# Patient Record
Sex: Male | Born: 1970 | Race: White | Hispanic: No | Marital: Single | Smoking: Never smoker
Health system: Southern US, Community
[De-identification: ages and names within clinical notes are randomized; demographics above are authoritative.]

## PROBLEM LIST (undated history)

## (undated) DIAGNOSIS — I10 Essential (primary) hypertension: Secondary | ICD-10-CM

## (undated) HISTORY — PX: EYE SURGERY: SHX253

---

## 2008-03-11 ENCOUNTER — Ambulatory Visit: Payer: Self-pay | Admitting: Internal Medicine

## 2008-06-14 ENCOUNTER — Ambulatory Visit: Payer: Self-pay | Admitting: Internal Medicine

## 2009-05-03 IMAGING — CR DG CHEST 2V
1 series · 2 of 2 positions shown · non-contrast
Comparison: none

REASON FOR EXAM: chest pain
COMMENTS:

[Series 1: view not recorded · 0.17mm/px · 2 of 2 slices shown]
[im 1/2]
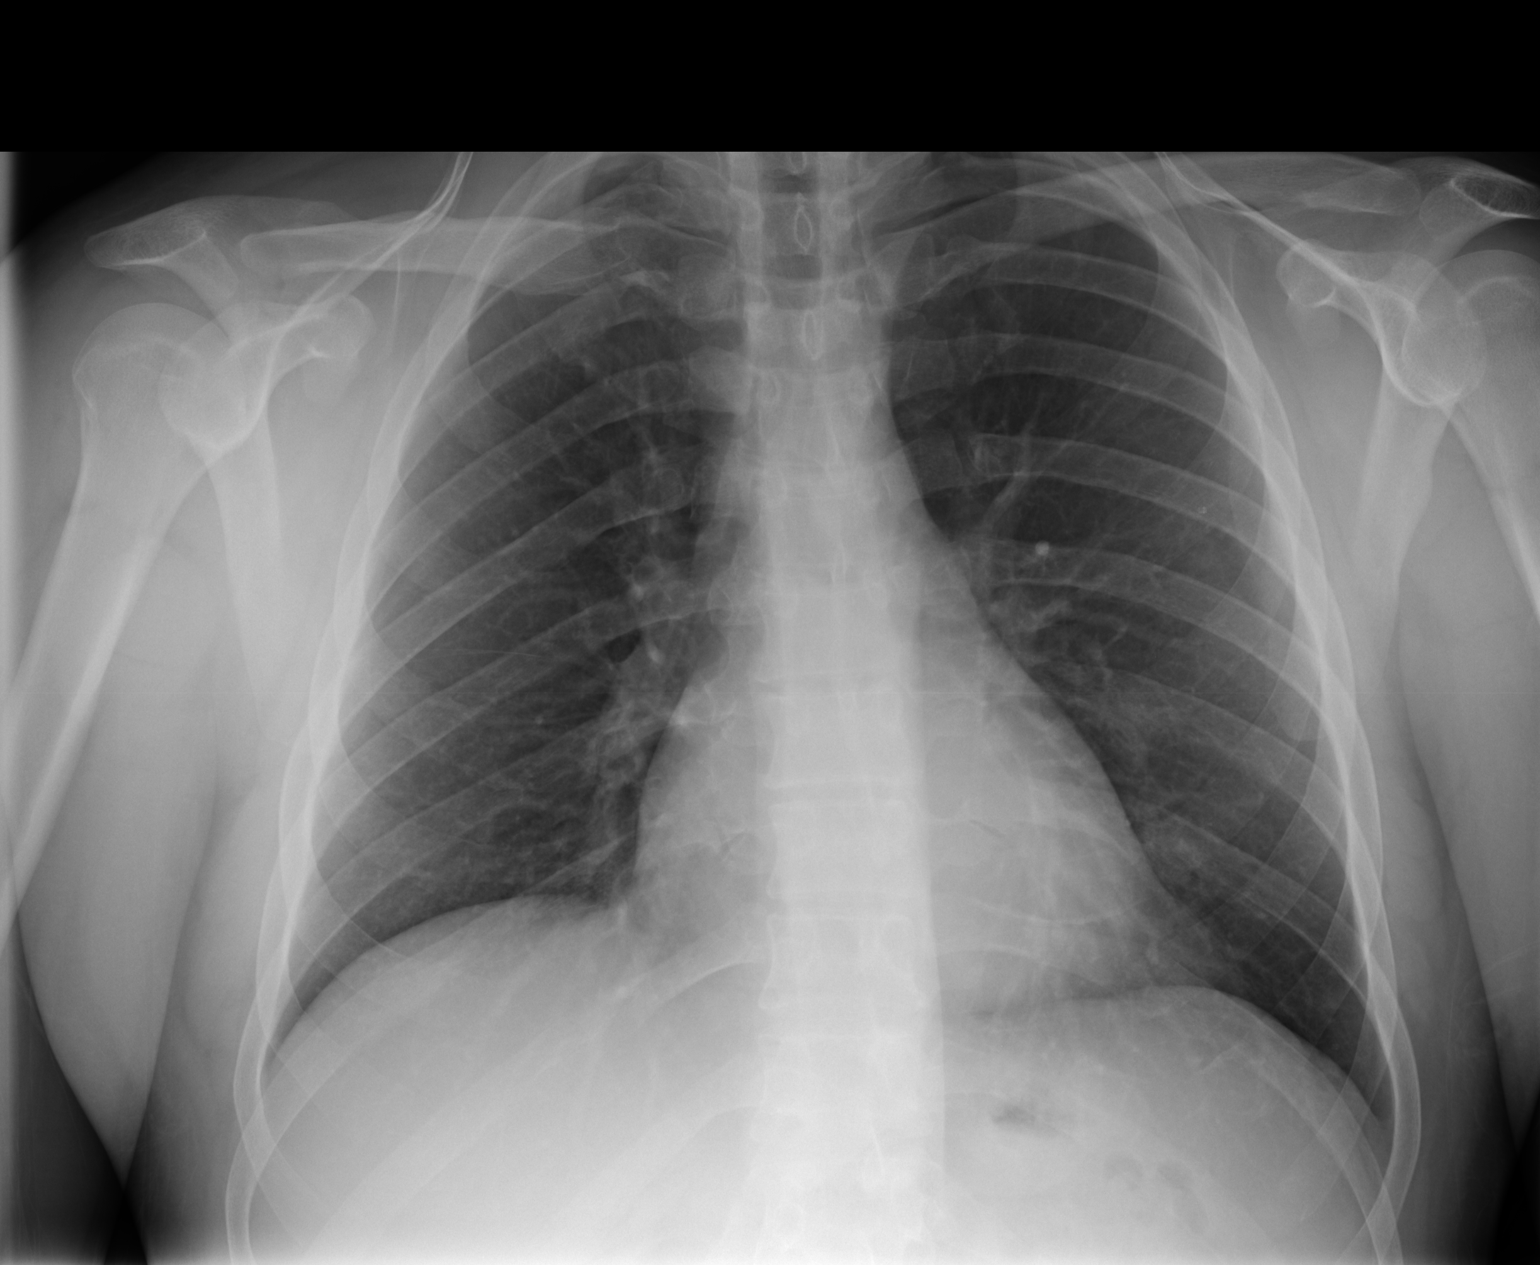
[im 2/2]
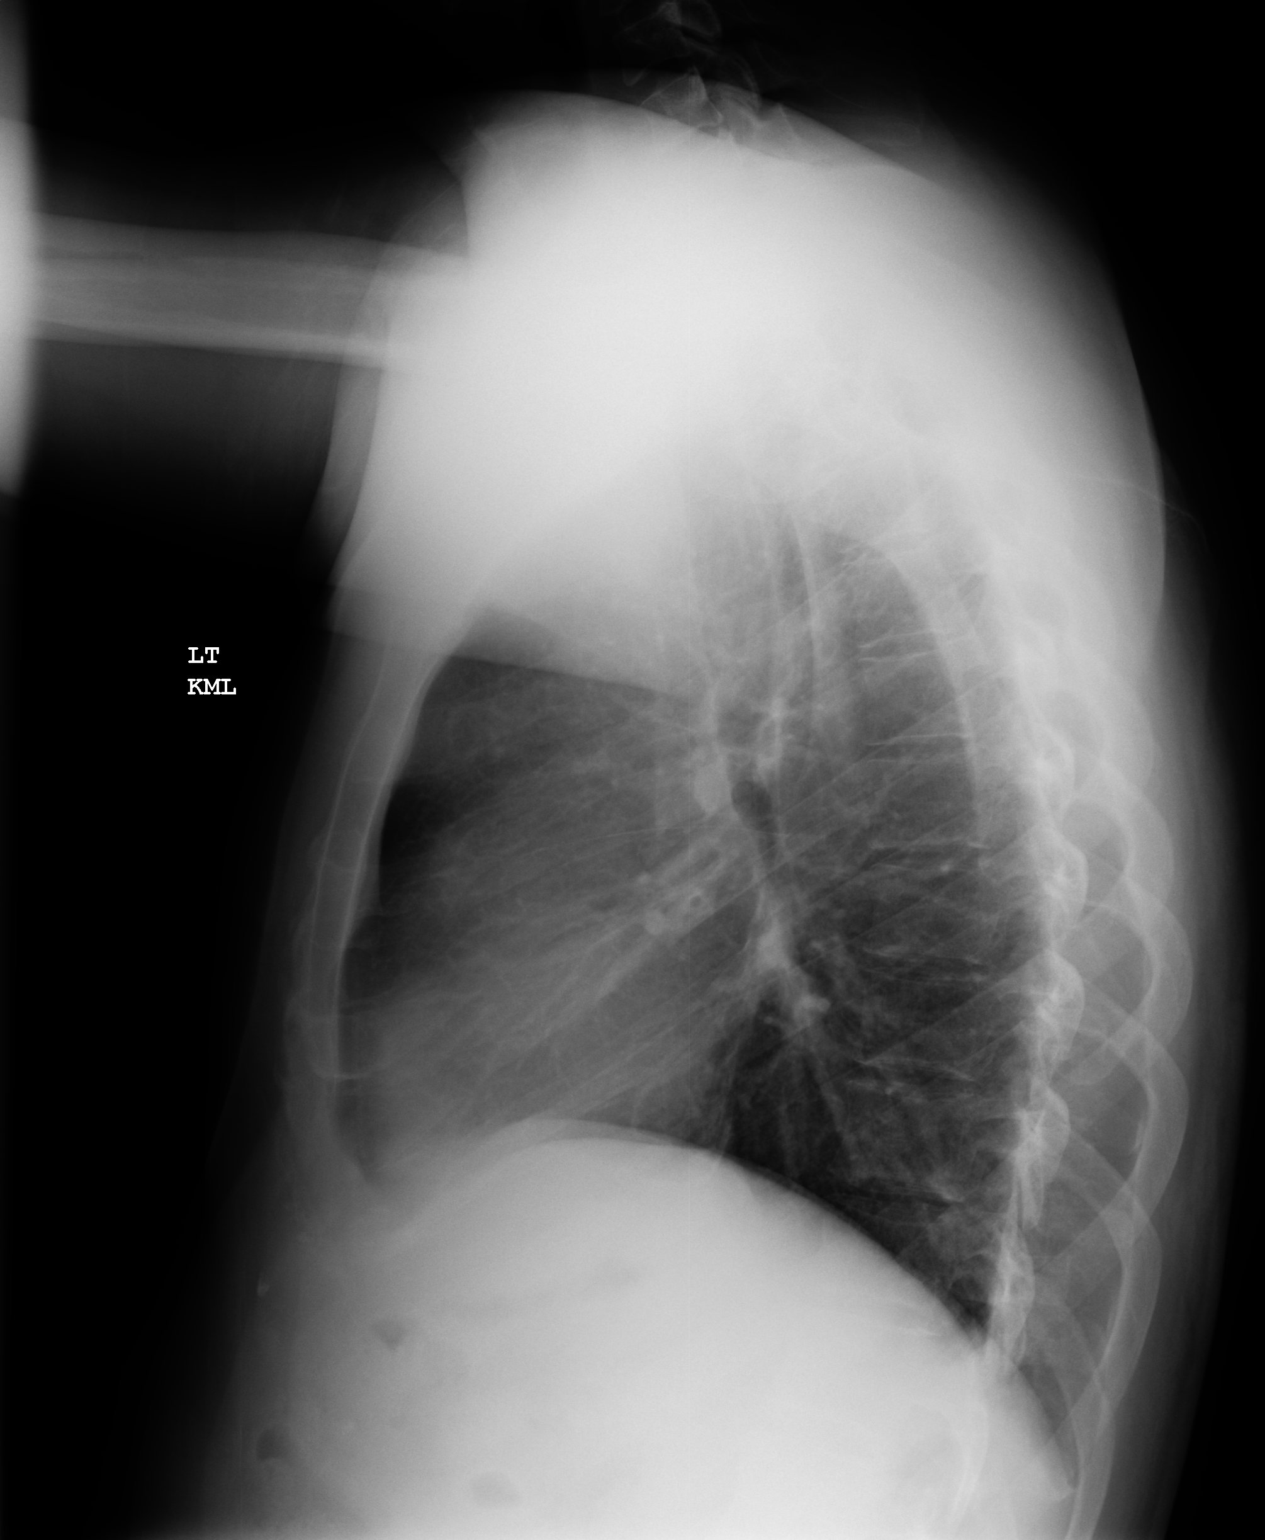

[2 of 2 positions shown; findings below may reference images not displayed]

PROCEDURE:     DXR - DXR CHEST PA (OR AP) AND LATERAL  - March 11, 2008 [DATE]

RESULT:     There is no previous exam for comparison.

The lungs are clear. The heart and pulmonary vessels are normal. The bony
and mediastinal structures are unremarkable. There is no effusion. There is
no pneumothorax or evidence of congestive failure.
IMPRESSION: No acute cardiopulmonary disease.

## 2009-05-28 ENCOUNTER — Emergency Department: Payer: Self-pay | Admitting: Emergency Medicine

## 2009-06-30 ENCOUNTER — Ambulatory Visit: Payer: Self-pay | Admitting: Otolaryngology

## 2009-08-06 IMAGING — US US RENAL KIDNEY
1 series · 17 of 25 positions shown · non-contrast
Comparison: none

REASON FOR EXAM: flank hematuria
COMMENTS:

[Series 1: us renal kidney · 17 of 26 slices shown]
[im 1/26]
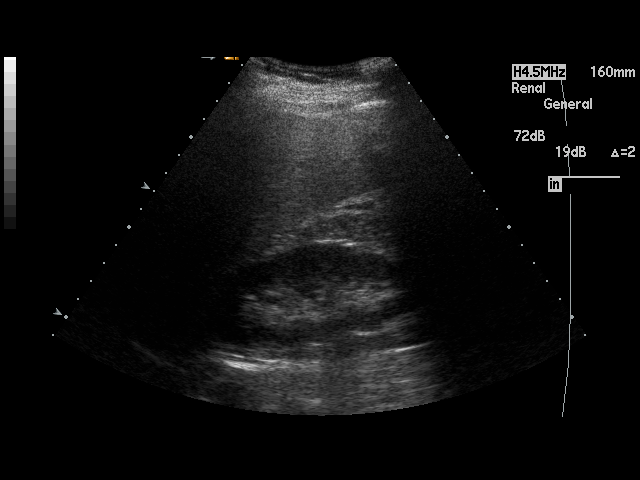
[im 3/26]
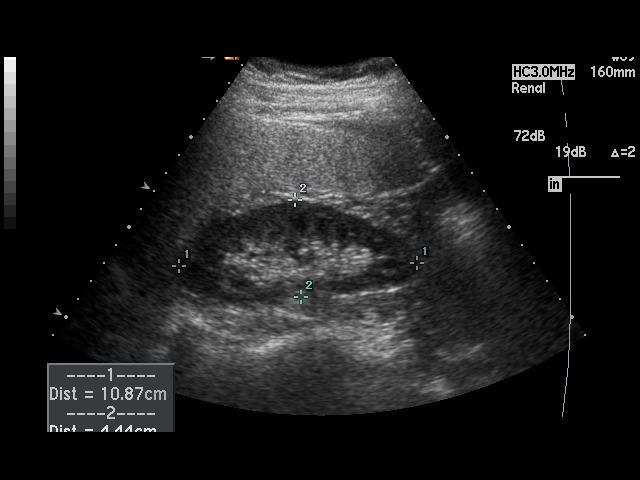
[im 4/26]
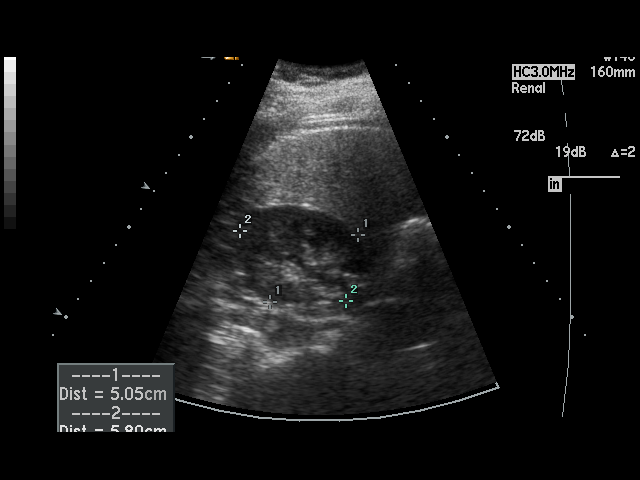
[im 6/26]
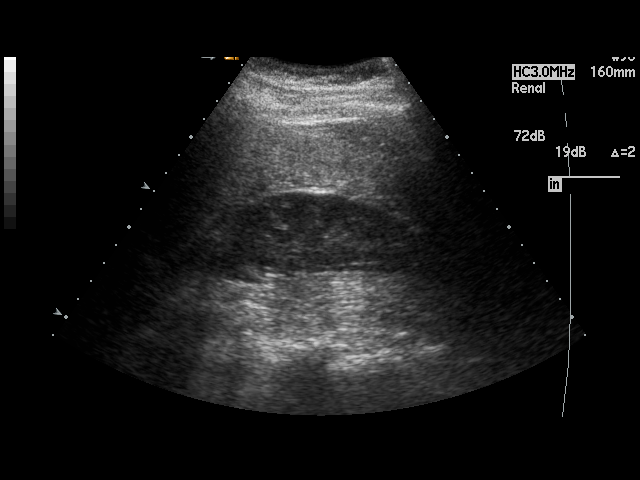
[im 7/26]
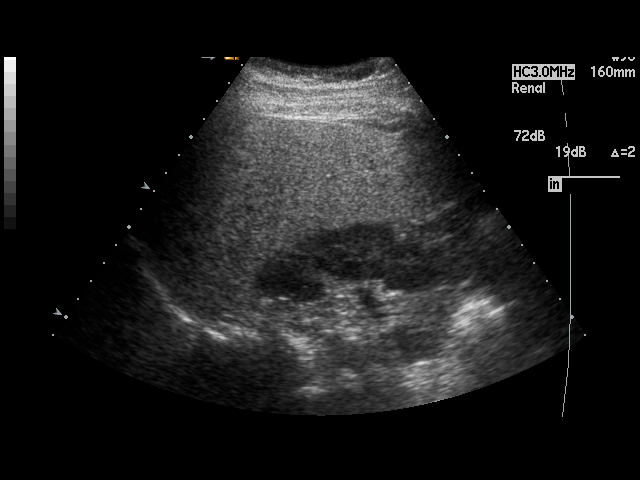
[im 9/26]
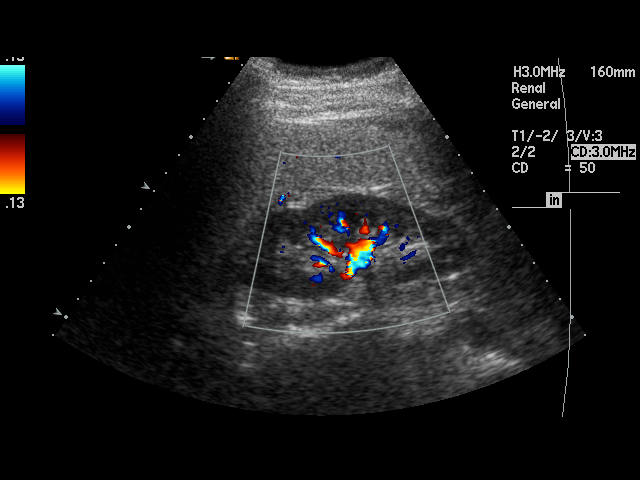
[im 10/26]
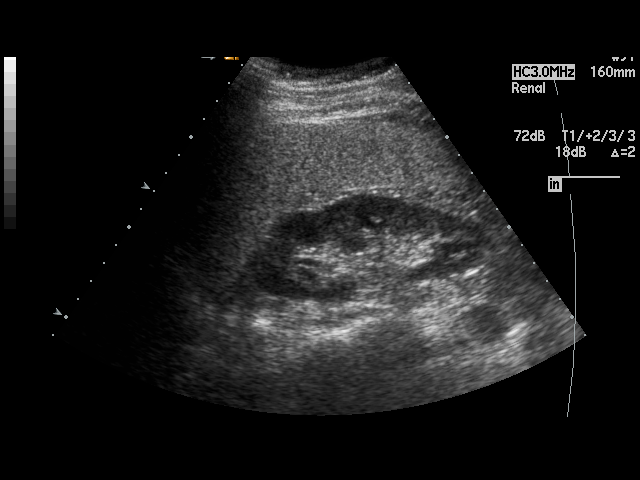
[im 12/26]
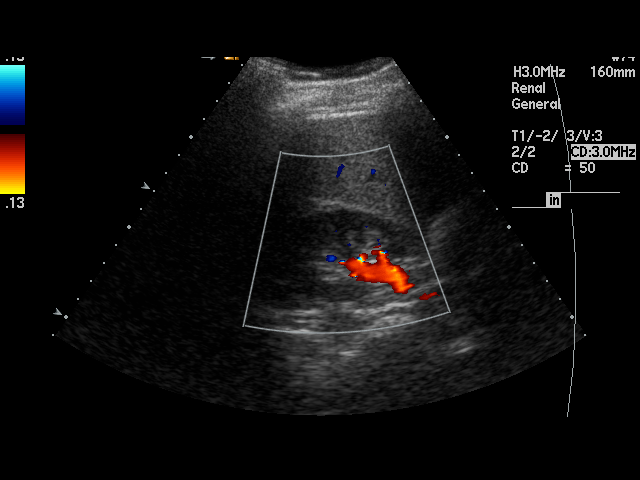
[im 13/26]
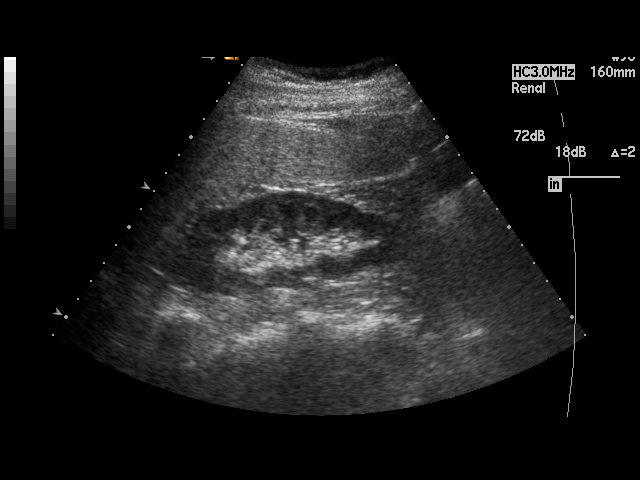
[im 14/26]
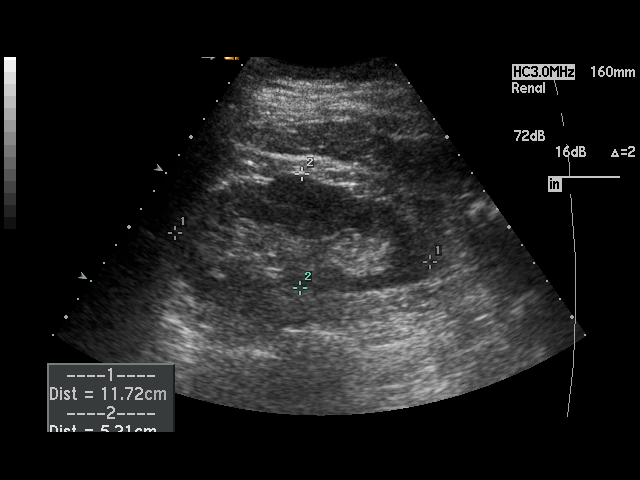
[im 16/26]
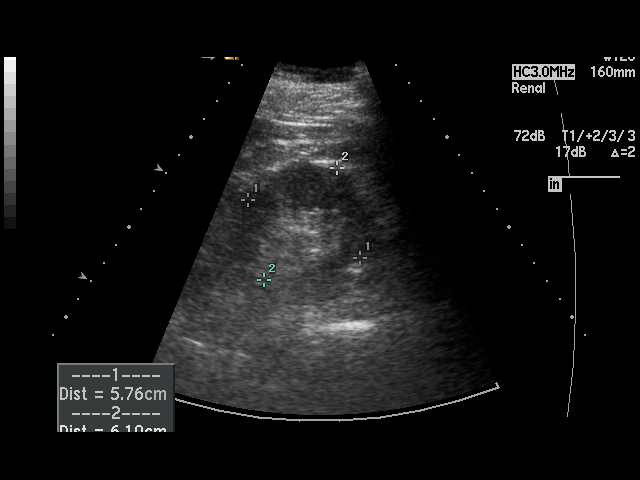
[im 17/26]
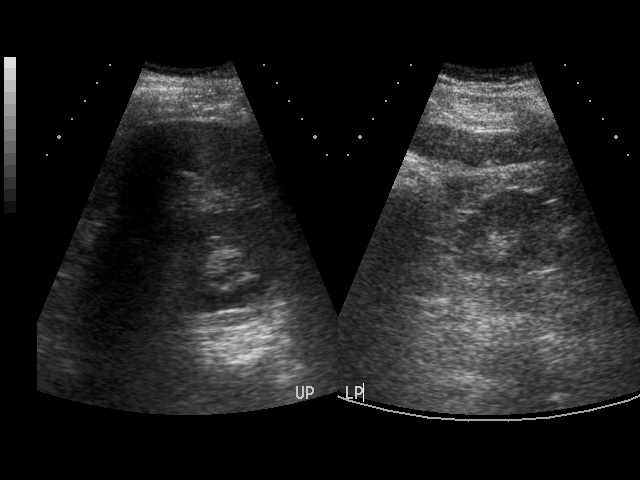
[im 19/26]
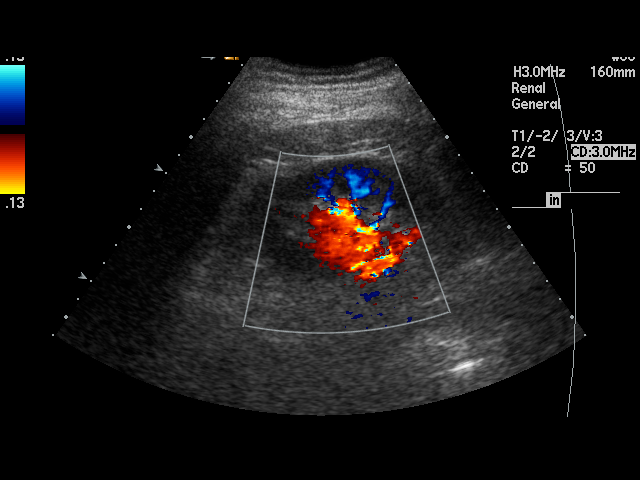
[im 20/26]
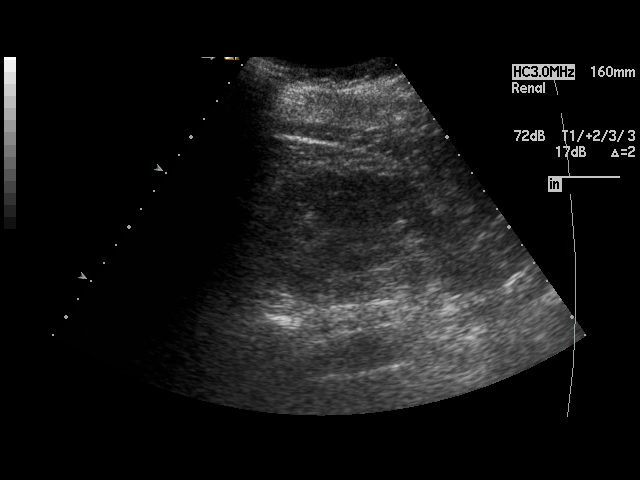
[im 22/26]
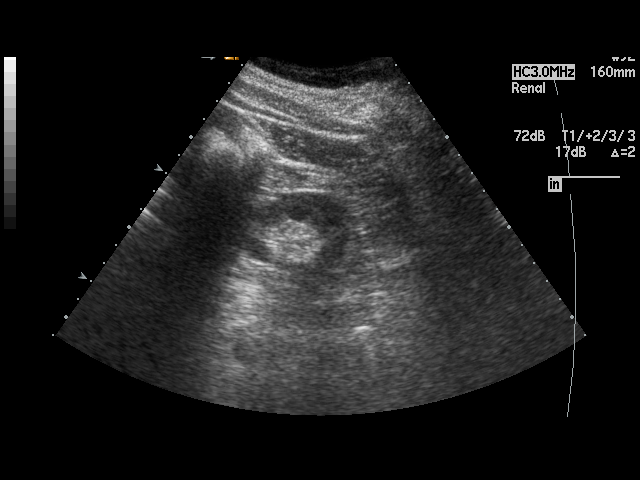
[im 23/26]
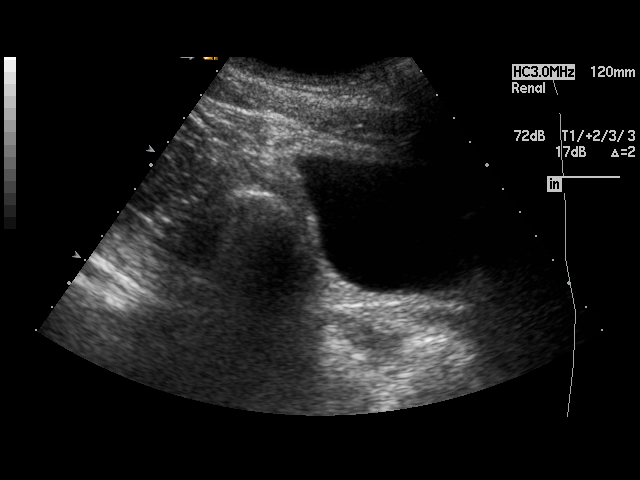
[im 26/26]
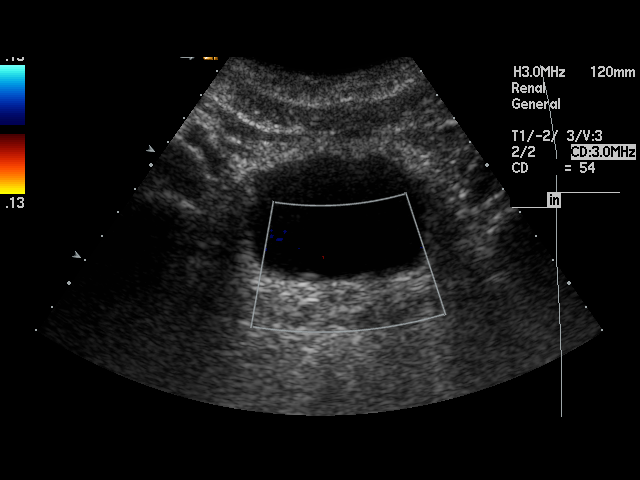

[17 of 25 positions shown; findings below may reference images not displayed]

PROCEDURE:     US  - US KIDNEY  - June 14, 2008  [DATE]

RESULT:     Ultrasound of the kidneys demonstrates the RIGHT kidney measures
11.1 x 5.0 x 5.8 cm while the LEFT kidney measures 11.7 x 5.5 x 6.1 cm.
There is no evidence of hydronephrosis. There is no shadowing to suggest
stones. No cysts or obstructive changes are evident. There is fluid in the
bladder consistent with urine.
IMPRESSION: Unremarkable ultrasound of the kidneys. No definite mass or
stones identified.

## 2010-07-29 ENCOUNTER — Emergency Department: Payer: Self-pay | Admitting: Emergency Medicine

## 2011-08-06 DIAGNOSIS — H5440 Blindness, one eye, unspecified eye: Secondary | ICD-10-CM | POA: Insufficient documentation

## 2016-09-21 ENCOUNTER — Other Ambulatory Visit: Payer: Self-pay | Admitting: Family Medicine

## 2016-09-21 ENCOUNTER — Ambulatory Visit
Admission: RE | Admit: 2016-09-21 | Discharge: 2016-09-21 | Disposition: A | Discharge status: Home / Self Care | Payer: Managed Care, Other (non HMO) | Source: Ambulatory Visit | Attending: Family Medicine | Admitting: Family Medicine

## 2016-09-21 DIAGNOSIS — Z7729 Contact with and (suspected ) exposure to other hazardous substances: Secondary | ICD-10-CM

## 2017-11-13 IMAGING — CR DG CHEST 2V
2 series · 2 of 2 positions shown · non-contrast
Comparison: 03/11/2008

CLINICAL DATA: Exposure to [place], [place] silicosis

EXAM:
CHEST  2 VIEW

[chest pa]
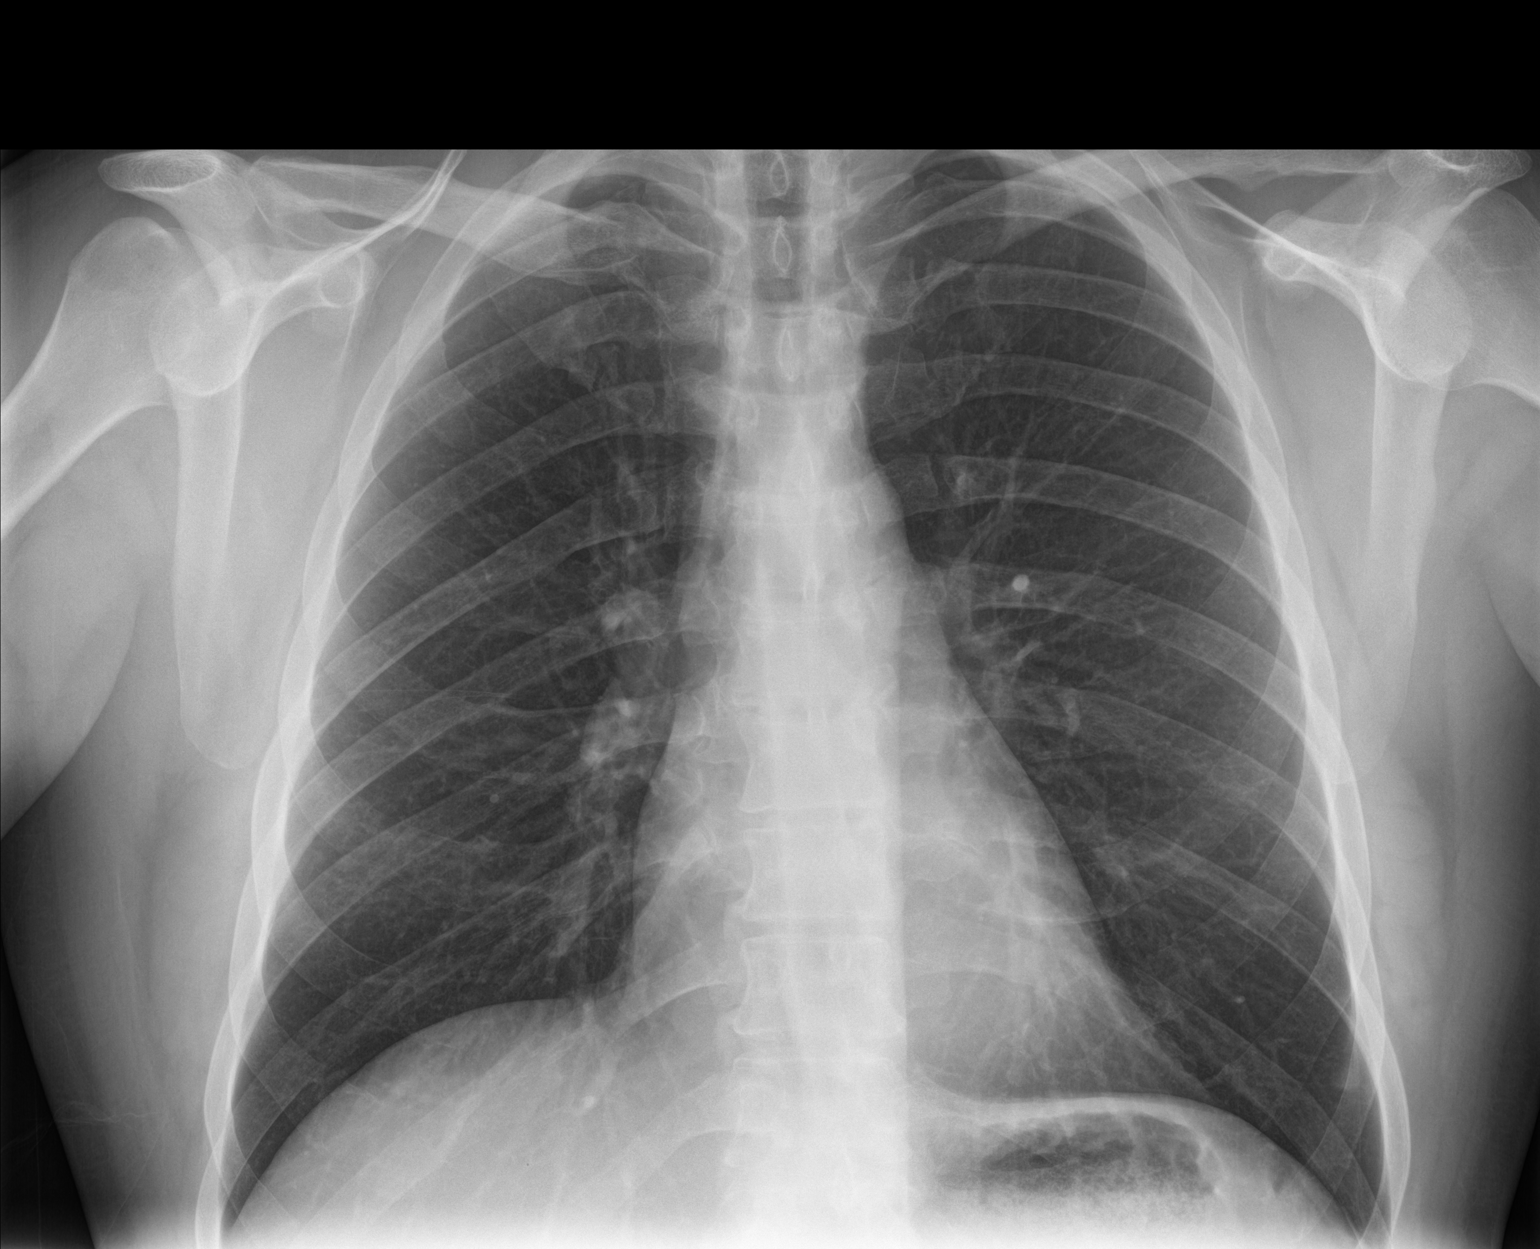

[chest lat]
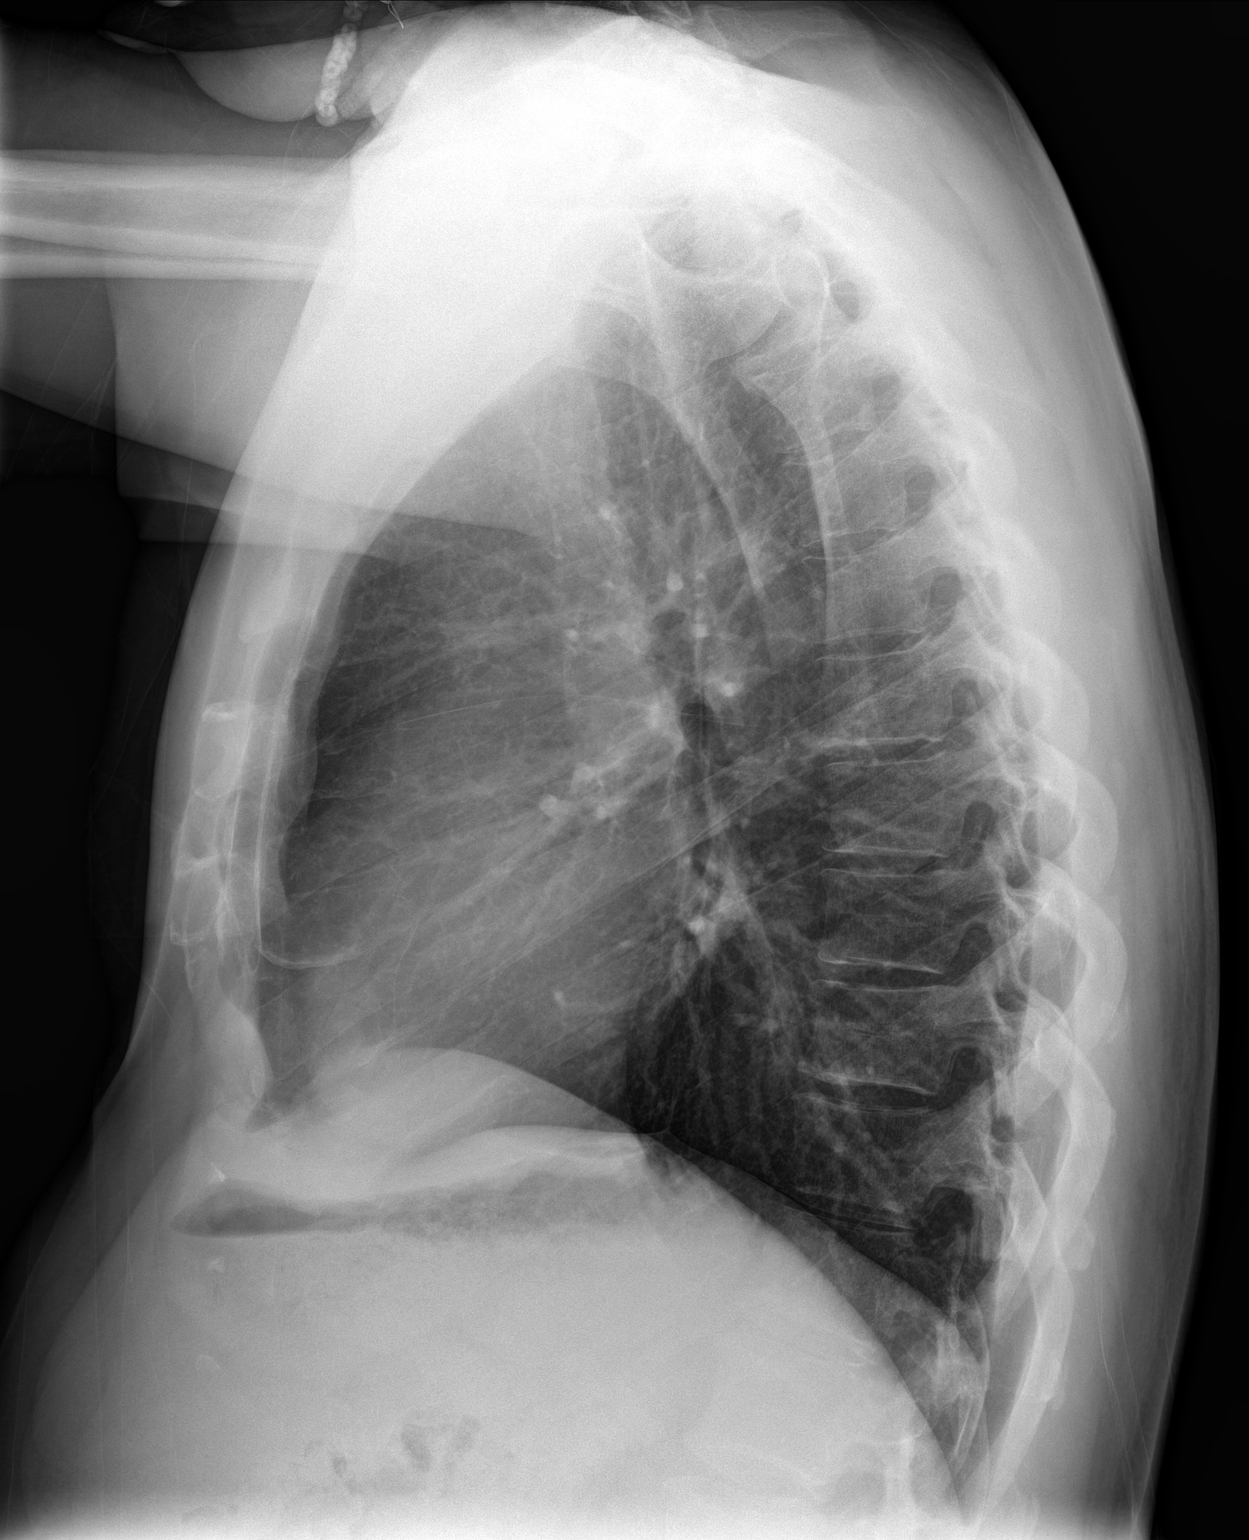

[2 of 2 positions shown; findings below may reference images not displayed]

FINDINGS: The heart size and mediastinal contours are within normal limits.
Both lungs are clear. No suspicious nodular densities are
identified. The visualized skeletal structures are unremarkable.
IMPRESSION: No active cardiopulmonary disease.

## 2018-07-11 ENCOUNTER — Other Ambulatory Visit: Payer: Self-pay | Admitting: Internal Medicine

## 2018-07-11 DIAGNOSIS — R945 Abnormal results of liver function studies: Secondary | ICD-10-CM

## 2018-07-11 DIAGNOSIS — R7989 Other specified abnormal findings of blood chemistry: Secondary | ICD-10-CM

## 2018-07-16 ENCOUNTER — Ambulatory Visit
Admission: RE | Admit: 2018-07-16 | Discharge: 2018-07-16 | Disposition: A | Discharge status: Home / Self Care | Payer: Managed Care, Other (non HMO) | Source: Ambulatory Visit | Attending: Internal Medicine | Admitting: Internal Medicine

## 2018-07-16 DIAGNOSIS — R7989 Other specified abnormal findings of blood chemistry: Secondary | ICD-10-CM

## 2018-07-16 DIAGNOSIS — R945 Abnormal results of liver function studies: Secondary | ICD-10-CM | POA: Diagnosis present

## 2018-07-16 DIAGNOSIS — K760 Fatty (change of) liver, not elsewhere classified: Secondary | ICD-10-CM | POA: Insufficient documentation

## 2018-09-28 ENCOUNTER — Ambulatory Visit
Admission: EM | Admit: 2018-09-28 | Discharge: 2018-09-28 | Disposition: A | Discharge status: Home / Self Care | Payer: Managed Care, Other (non HMO)

## 2018-09-28 DIAGNOSIS — S161XXA Strain of muscle, fascia and tendon at neck level, initial encounter: Secondary | ICD-10-CM

## 2018-09-28 DIAGNOSIS — I10 Essential (primary) hypertension: Secondary | ICD-10-CM | POA: Diagnosis not present

## 2018-09-28 DIAGNOSIS — R51 Headache: Secondary | ICD-10-CM

## 2018-09-28 DIAGNOSIS — R519 Headache, unspecified: Secondary | ICD-10-CM

## 2018-09-28 HISTORY — DX: Essential (primary) hypertension: I10

## 2018-09-28 MED ORDER — NAPROXEN 500 MG PO TABS: 500.0000 mg | ORAL_TABLET | Freq: Two times a day (BID) | ORAL | 0 refills | Status: DC | PRN

## 2018-09-28 MED ORDER — KETOROLAC TROMETHAMINE 60 MG/2ML IM SOLN
60.0000 mg | Freq: Once | INTRAMUSCULAR | Status: AC
  Administered 2018-09-28: 60 mg via INTRAMUSCULAR

## 2018-09-28 MED ORDER — CYCLOBENZAPRINE HCL 10 MG PO TABS: ORAL_TABLET | ORAL | 0 refills | Status: DC
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
## 2018-09-28 NOTE — Discharge Instructions (Addendum)
You were given a shot of Toradol 60mg  IM now to help with pain and inflammation. May start Flexeril 10mg  tablets- take 1/2 to 1 tablet by mouth every 8 hours as needed for headache and muscle spasms. May also take Naproxen 500mg  twice a day as needed. Recommend warm compresses to area for comfort. Continue to monitor blood pressure- may be elevated today due to pain. Follow-up with your PCP or Orthopedic in 4 to 5 days if not improving.
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
## 2018-10-03 DIAGNOSIS — I10 Essential (primary) hypertension: Secondary | ICD-10-CM | POA: Insufficient documentation

## 2018-10-03 DIAGNOSIS — F411 Generalized anxiety disorder: Secondary | ICD-10-CM | POA: Insufficient documentation

## 2018-10-07 ENCOUNTER — Other Ambulatory Visit: Payer: Self-pay | Admitting: Internal Medicine

## 2018-10-07 DIAGNOSIS — M545 Low back pain, unspecified: Secondary | ICD-10-CM

## 2018-10-27 ENCOUNTER — Ambulatory Visit: Payer: Managed Care, Other (non HMO)

## 2019-04-08 ENCOUNTER — Inpatient Hospital Stay
Admission: AD | Admit: 2019-04-08 | Discharge: 2019-04-16 | DRG: 885 | Disposition: A | Discharge status: Home / Self Care | Payer: No Typology Code available for payment source | Attending: Psychiatry | Admitting: Psychiatry

## 2019-04-08 ENCOUNTER — Emergency Department
Admission: EM | Admit: 2019-04-08 | Discharge: 2019-04-08 | Disposition: A | Discharge status: Other Acute Inpatient Hospital | Payer: No Typology Code available for payment source | Attending: Emergency Medicine | Admitting: Emergency Medicine

## 2019-04-08 ENCOUNTER — Encounter: Payer: Self-pay | Admitting: *Deleted

## 2019-04-08 ENCOUNTER — Other Ambulatory Visit: Payer: Self-pay

## 2019-04-08 ENCOUNTER — Inpatient Hospital Stay: Admission: AD | Admit: 2019-04-08 | Payer: No Typology Code available for payment source | Admitting: Psychiatry

## 2019-04-08 DIAGNOSIS — F1410 Cocaine abuse, uncomplicated: Secondary | ICD-10-CM | POA: Diagnosis not present

## 2019-04-08 DIAGNOSIS — Z1159 Encounter for screening for other viral diseases: Secondary | ICD-10-CM | POA: Diagnosis not present

## 2019-04-08 DIAGNOSIS — F1910 Other psychoactive substance abuse, uncomplicated: Secondary | ICD-10-CM | POA: Diagnosis present

## 2019-04-08 DIAGNOSIS — H5462 Unqualified visual loss, left eye, normal vision right eye: Secondary | ICD-10-CM | POA: Diagnosis present

## 2019-04-08 DIAGNOSIS — F1010 Alcohol abuse, uncomplicated: Secondary | ICD-10-CM

## 2019-04-08 DIAGNOSIS — F1510 Other stimulant abuse, uncomplicated: Secondary | ICD-10-CM

## 2019-04-08 DIAGNOSIS — F322 Major depressive disorder, single episode, severe without psychotic features: Secondary | ICD-10-CM | POA: Diagnosis present

## 2019-04-08 DIAGNOSIS — R45851 Suicidal ideations: Secondary | ICD-10-CM | POA: Diagnosis present

## 2019-04-08 DIAGNOSIS — I10 Essential (primary) hypertension: Secondary | ICD-10-CM | POA: Insufficient documentation

## 2019-04-08 DIAGNOSIS — F332 Major depressive disorder, recurrent severe without psychotic features: Secondary | ICD-10-CM

## 2019-04-08 DIAGNOSIS — H5440 Blindness, one eye, unspecified eye: Secondary | ICD-10-CM | POA: Diagnosis present

## 2019-04-08 DIAGNOSIS — R4589 Other symptoms and signs involving emotional state: Secondary | ICD-10-CM | POA: Diagnosis present

## 2019-04-08 DIAGNOSIS — F10929 Alcohol use, unspecified with intoxication, unspecified: Secondary | ICD-10-CM | POA: Diagnosis not present

## 2019-04-08 DIAGNOSIS — F1020 Alcohol dependence, uncomplicated: Secondary | ICD-10-CM | POA: Diagnosis not present

## 2019-04-08 DIAGNOSIS — F329 Major depressive disorder, single episode, unspecified: Secondary | ICD-10-CM | POA: Insufficient documentation

## 2019-04-08 DIAGNOSIS — F1310 Sedative, hypnotic or anxiolytic abuse, uncomplicated: Secondary | ICD-10-CM

## 2019-04-08 LAB — SALICYLATE LEVEL: Salicylate Lvl: <7 mg/dL (ref 2.8–30.0)

## 2019-04-08 LAB — CBC
HCT: 44.9 % (ref 39.0–52.0)
Hemoglobin: 14.7 g/dL (ref 13.0–17.0)
MCH: 30.1 pg (ref 26.0–34.0)
MCHC: 32.7 g/dL (ref 30.0–36.0)
MCV: 91.8 fL (ref 80.0–100.0)
Platelets: 358 10*3/uL (ref 150–400)
RBC: 4.89 MIL/uL (ref 4.22–5.81)
RDW: 12.8 % (ref 11.5–15.5)
WBC: 10 10*3/uL (ref 4.0–10.5)
nRBC: 0 % (ref 0.0–0.2)

## 2019-04-08 LAB — COMPREHENSIVE METABOLIC PANEL
ALT: 124 U/L — ABNORMAL HIGH (ref 0–44)
AST: 107 U/L — ABNORMAL HIGH (ref 15–41)
Albumin: 4.5 g/dL (ref 3.5–5.0)
Alkaline Phosphatase: 93 U/L (ref 38–126)
Anion gap: 12 (ref 5–15)
BUN: 7 mg/dL (ref 6–20)
CO2: 24 mmol/L (ref 22–32)
Calcium: 8.8 mg/dL — ABNORMAL LOW (ref 8.9–10.3)
Chloride: 105 mmol/L (ref 98–111)
Creatinine, Ser: 0.85 mg/dL (ref 0.61–1.24)
GFR calc Af Amer: >60 mL/min (ref >60)
GFR calc non Af Amer: >60 mL/min (ref >60)
Glucose, Bld: 94 mg/dL (ref 70–99)
Potassium: 4.3 mmol/L (ref 3.5–5.1)
Sodium: 141 mmol/L (ref 135–145)
Total Bilirubin: 0.2 mg/dL — ABNORMAL LOW (ref 0.3–1.2)
Total Protein: 7.9 g/dL (ref 6.5–8.1)

## 2019-04-08 LAB — URINE DRUG SCREEN, QUALITATIVE (ARMC ONLY)
Amphetamines, Ur Screen: POSITIVE — AB
Barbiturates, Ur Screen: NOT DETECTED
Benzodiazepine, Ur Scrn: POSITIVE — AB
Cannabinoid 50 Ng, Ur ~~LOC~~: NOT DETECTED
Cocaine Metabolite,Ur ~~LOC~~: POSITIVE — AB
MDMA (Ecstasy)Ur Screen: NOT DETECTED
Methadone Scn, Ur: NOT DETECTED
Opiate, Ur Screen: NOT DETECTED
Phencyclidine (PCP) Ur S: NOT DETECTED
Tricyclic, Ur Screen: POSITIVE — AB

## 2019-04-08 LAB — ACETAMINOPHEN LEVEL: Acetaminophen (Tylenol), Serum: <10 ug/mL — ABNORMAL LOW (ref 10–30)

## 2019-04-08 LAB — SARS CORONAVIRUS 2 BY RT PCR (HOSPITAL ORDER, PERFORMED IN ~~LOC~~ HOSPITAL LAB): SARS Coronavirus 2: NEGATIVE

## 2019-04-08 LAB — ETHANOL: Alcohol, Ethyl (B): 123 mg/dL — ABNORMAL HIGH (ref <10)

## 2019-04-08 MED ORDER — NAPROXEN 500 MG PO TABS
500.0000 mg | ORAL_TABLET | Freq: Two times a day (BID) | ORAL | Status: DC | PRN
  Filled 2019-04-08: qty 1

## 2019-04-08 MED ORDER — ACETAMINOPHEN 325 MG PO TABS: 650.0000 mg | ORAL_TABLET | Freq: Four times a day (QID) | ORAL | Status: DC | PRN

## 2019-04-08 MED ORDER — HYDROCHLOROTHIAZIDE 12.5 MG PO CAPS
12.5000 mg | ORAL_CAPSULE | Freq: Every day | ORAL | Status: DC
  Administered 2019-04-08: 17:00:00 12.5 mg via ORAL
  Filled 2019-04-08: qty 1

## 2019-04-08 MED ORDER — LOSARTAN POTASSIUM-HCTZ 50-12.5 MG PO TABS: 1.0000 | ORAL_TABLET | Freq: Every day | ORAL | Status: DC

## 2019-04-08 MED ORDER — ZIPRASIDONE MESYLATE 20 MG IM SOLR: 20.0000 mg | INTRAMUSCULAR | Status: DC | PRN

## 2019-04-08 MED ORDER — LORAZEPAM 1 MG PO TABS
1.0000 mg | ORAL_TABLET | ORAL | Status: AC | PRN
  Administered 2019-04-09: 1 mg via ORAL
  Filled 2019-04-08: qty 1

## 2019-04-08 MED ORDER — ESCITALOPRAM OXALATE 10 MG PO TABS
10.0000 mg | ORAL_TABLET | Freq: Every day | ORAL | Status: DC
  Administered 2019-04-08: 10 mg via ORAL
  Filled 2019-04-08: qty 1

## 2019-04-08 MED ORDER — OLANZAPINE 5 MG PO TBDP: 10.0000 mg | ORAL_TABLET | Freq: Three times a day (TID) | ORAL | Status: DC | PRN

## 2019-04-08 MED ORDER — DIPHENHYDRAMINE HCL 25 MG PO CAPS
50.0000 mg | ORAL_CAPSULE | Freq: Four times a day (QID) | ORAL | Status: DC | PRN
  Administered 2019-04-14: 50 mg via ORAL
  Filled 2019-04-08 (×2): qty 2

## 2019-04-08 MED ORDER — HYDROCHLOROTHIAZIDE 12.5 MG PO CAPS
12.5000 mg | ORAL_CAPSULE | Freq: Every day | ORAL | Status: DC
  Administered 2019-04-09 – 2019-04-16 (×8): 12.5 mg via ORAL
  Filled 2019-04-08 (×8): qty 1

## 2019-04-08 MED ORDER — MAGNESIUM HYDROXIDE 400 MG/5ML PO SUSP: 30.0000 mL | Freq: Every day | ORAL | Status: DC | PRN

## 2019-04-08 MED ORDER — LOSARTAN POTASSIUM 50 MG PO TABS
50.0000 mg | ORAL_TABLET | Freq: Every day | ORAL | Status: DC
  Administered 2019-04-09 – 2019-04-16 (×8): 50 mg via ORAL
  Filled 2019-04-08 (×8): qty 1

## 2019-04-08 MED ORDER — HYDROXYZINE HCL 25 MG PO TABS
25.0000 mg | ORAL_TABLET | Freq: Three times a day (TID) | ORAL | Status: DC | PRN
  Administered 2019-04-09 – 2019-04-15 (×8): 25 mg via ORAL
  Filled 2019-04-08 (×8): qty 1

## 2019-04-08 MED ORDER — LOSARTAN POTASSIUM 50 MG PO TABS
50.0000 mg | ORAL_TABLET | Freq: Every day | ORAL | Status: DC
  Administered 2019-04-08: 17:00:00 50 mg via ORAL
  Filled 2019-04-08: qty 1

## 2019-04-08 MED ORDER — NAPROXEN 500 MG PO TABS: 500.0000 mg | ORAL_TABLET | Freq: Two times a day (BID) | ORAL | Status: DC | PRN

## 2019-04-08 MED ORDER — ALUM & MAG HYDROXIDE-SIMETH 200-200-20 MG/5ML PO SUSP: 30.0000 mL | ORAL | Status: DC | PRN

## 2019-04-08 MED ORDER — ESCITALOPRAM OXALATE 10 MG PO TABS
10.0000 mg | ORAL_TABLET | Freq: Every day | ORAL | Status: DC
  Administered 2019-04-09: 08:00:00 10 mg via ORAL
  Filled 2019-04-08: qty 1

## 2019-04-08 NOTE — ED Notes (Signed)
Pt dressed into hospital scrubs by this RN with sheriff present in triage room. Belongings placed into 1 of 1 bag.  Contents included: 1 pair khaki long pants, 1 pair white socks, 1 pair camo crocs, 1 orange hooded sweatshirt, 1 t shirt, blue sunglasses, 1 hat, 1 black phone.  Pt has penis piercing that he indicates does not come out.  Receiving RN, Prudencio Burly, informed.

## 2019-04-08 NOTE — Consult Note (Addendum)
[facility] Face-to-Face Psychiatry Consult   Reason for Consult:  Suicidal with intent to complete suicide by carbon monoxide poisoning Referring Physician: Dr. Don Perking Patient Identification: Brad Chang MRN:  [id number] Principal Diagnosis: Suicidal ideation Diagnosis:  Principal Problem:   Suicidal ideation Active Problems:   Alcohol intoxication (HCC)   Cocaine abuse (HCC)  Patient is seen, chart is reviewed, collateral obtained from wife, Arnet Hofferber. Total Time spent with patient: 1 hour   Patient prefers to go by Brad Chang  Subjective: "I have been having a lot of issues.  I stopped working because of them.  Mother's Day was really hard for me, since my mother passed this past Jan 06, 2023."  HPI:  Brad Chang is a 48 y.o. male patient with a history of alcohol abuse, cocaine abuse, hypertension, depression who presents IVC by police for suicidal ideation.  Patient was pulled over by police today and told the cops that he was having suicidal thoughts.  The plan was to die by carbon monoxide poisoning.  Patient reports prior suicidal attempts in the past.  He was supposed to be on Zoloft however he stopped taking that several months ago.  He drinks beer regularly.  Uses cocaine.  He reports severe depression over the last few days, since Mother's Day, due to recent loss of his mother and marital problems.  On evaluation, patient is calm and cooperative.  He appears ravenous and is quickly eating food that is provided for him.  He states that he went on a drinking and cocaine binge since 04/02/2019 and has not been eating or sleeping during this time.  Patient reports that he last used cocaine approximately 2 days ago.  He reports he has been drinking a case of Bud Light daily and this time.  Patient describes a longstanding history of depression for which he was prescribed Zoloft and Lexapro in the past, along with Adderall by his PCP Dr. Dorena Cookey.  Patient also endorses using Klonopin in  the past for sleep.  Patient describes his symptoms of depression have been worsening since Jan 06, 2023 after his mother's death.  He reports symptoms of having a headache since 7 May.  He describes psychomotor retardation, isolating self, crying.  Not sleeping, feeling worthless and "like a piece of shit".  Patient describes having increased stress and pressure at work, as well as in his marriage of 12 years.  He does describe a good relationship with his adult son and daughter, and did reach out to them to say, "goodbye."  As he was planning a suicide.  Patient reports "I just feel burned out of everything."  Patient reports that he was planning to commit suicide by carbon monoxide poisoning in his car.  However when he was driving, he was pulled over for reckless driving.  He states he has a court date on August 5 for these charges.  Patient denies access to guns.  Patient continues to endorse suicidal ideation.  He denies HI, he denies AVH.  He denies any past history of psychosis or mania.  Patient denies history of alcohol withdrawal symptoms.  Past Psychiatric History: Depression, polysubstance abuse; patient has a history of making suicidal threats to include with weapons. He denies any past history of self-harm or suicide attempt. Patient previously took Zoloft which he found to be ineffective.  He was changed to Lexapro which he found helpful. Patient reports previously being prescribed Adderall and Klonopin which she found helpful  Risk to Self:  Yes Risk to Others:  Yes Prior Inpatient Therapy:  None Prior Outpatient Therapy:   Medications have been prescribed by patient's PCP.  Collateral obtained from wife, Cala Bradford: Cala Bradford states that patient has been struggling with depression which has been getting worse in the past 3 years, and he has been suicidal for years and has been abusing alcohol which increases his suicidal tendencies.  Wife reports that patient has been more depressed since his  mother died in 01-01-2023 and then had a difficult time this past Mother's Day.  She states that he has been spending thousands of dollars a week on drugs, by borrowing money from friends and selling household items in order to buy cocaine.  Wife states that patient children have had less contact with him, and he is feeling very lonely.  Wife reports that when he went out drinking with a friend over the weekend, she told him not to come back home if he was drunk, she states he did not return.  She found out that he did not show up for work on Monday or Tuesday, and was getting calls from his adult children that he was not responding to their text messages.  Wife states that she filed a missing persons report, after which he was found.  Wife states she has kicked him out of the house at this point. She states that he has been stealing medications from her to include cough syrup with codeine, Flexeril and 2 tablets of Valium that she had for a dental appointment.  She is aware that he has been taking Xanax and opiates.  Wife describes that she has video of him threatening a suicide attempt back from October 2019 when he stole her gun and went onto Facebook live saying he was going to kill himself while holding the gun to his head at his father's grave site.  The gun was taken away by police at that time, but wife now has the gun back in her possession. Wife is unaware of any past trauma that patient has.  She states that she is afraid to sleep with him because he awakens with nightmares and is violent in his sleep.  She notes that patient sleeps excessively.  She reports that sometimes he sleeps for 3 to 4 days in a row without getting up.  She denies mania episodes when not in the context of stimulant abuse.  Past Medical History:  Past Medical History:  Diagnosis Date  . Hypertension     Past Surgical History:  Procedure Laterality Date  . EYE SURGERY     Family History:  Family History  Problem  Relation Age of Onset  . Hypertension Mother   . Cancer Father    Family Psychiatric  History: None indicated  Social History:  Social History   Substance and Sexual Activity  Alcohol Use Yes     Social History   Substance and Sexual Activity  Drug Use Never    Social History   Socioeconomic History  . Marital status: Single    Spouse name: Not on file  . Number of children: Not on file  . Years of education: Not on file  . Highest education level: Not on file  Occupational History  . Not on file  Social Needs  . Financial resource strain: Not on file  . Food insecurity:    Worry: Not on file    Inability: Not on file  . Transportation needs:    Medical: Not on file    Non-medical: Not  on file  Tobacco Use  . Smoking status: Never Smoker  . Smokeless tobacco: Never Used  Substance and Sexual Activity  . Alcohol use: Yes  . Drug use: Never  . Sexual activity: Not on file  Lifestyle  . Physical activity:    Days per week: Not on file    Minutes per session: Not on file  . Stress: Not on file  Relationships  . Social connections:    Talks on phone: Not on file    Gets together: Not on file    Attends religious service: Not on file    Active member of club or organization: Not on file    Attends meetings of clubs or organizations: Not on file    Relationship status: Not on file  Other Topics Concern  . Not on file  Social History Narrative  . Not on file   Additional Social History:  Patient divorced and remarried. Patient married to current wife for 12 years.  She has 2 adult children (son and daughter).  Patient has given consent to talk to his daughter, Teressa Senter and his wife, Amitai Delaughter.  Patient with recent marital strife, he is currently homeless.  Patient describes drinking alcohol since the age of 59.  He states he had a period of sobriety for 10 years in his 58s, and then restarted drinking between the ages of 33 and 76 years, and  has been escalating use in that time.  Patient previously worked in [employer].  Patient states that he quit work, however wife states that he was fired after not showing up to work Monday and Tuesday this past week.  Patient has no charges for reckless driving today, with an August 5 court date.  Patient does have access to guns, and has made suicide threats with guns in the past.  Allergies:   Allergies  Allergen Reactions  . Penicillins Other (See Comments)    Labs:  Results for orders placed or performed during the hospital encounter of 04/08/19 (from the past 48 hour(s))  Comprehensive metabolic panel     Status: Abnormal   Collection Time: 04/08/19  3:19 PM  Result Value Ref Range   Sodium 141 135 - 145 mmol/L   Potassium 4.3 3.5 - 5.1 mmol/L   Chloride 105 98 - 111 mmol/L   CO2 24 22 - 32 mmol/L   Glucose, Bld 94 70 - 99 mg/dL   BUN 7 6 - 20 mg/dL   Creatinine, Ser 2.70 0.61 - 1.24 mg/dL   Calcium 8.8 (L) 8.9 - 10.3 mg/dL   Total Protein 7.9 6.5 - 8.1 g/dL   Albumin 4.5 3.5 - 5.0 g/dL   AST 350 (H) 15 - 41 U/L   ALT 124 (H) 0 - 44 U/L   Alkaline Phosphatase 93 38 - 126 U/L   Total Bilirubin 0.2 (L) 0.3 - 1.2 mg/dL   GFR calc non Af Amer >60 >60 mL/min   GFR calc Af Amer >60 >60 mL/min   Anion gap 12 5 - 15    Comment: Performed at [facility], [address] [place], [place] [address]  Ethanol     Status: Abnormal   Collection Time: 04/08/19  3:19 PM  Result Value Ref Range   Alcohol, Ethyl (B) 123 (H) <10 mg/dL    Comment: (NOTE) Lowest detectable limit for serum alcohol is 10 mg/dL. For medical purposes only. Performed at [facility], [address] [place], [place] [address]  Salicylate level     Status: None   Collection Time: 04/08/19  3:19 PM  Result Value Ref Range   Salicylate Lvl <7.0 2.8 - 30.0 mg/dL    Comment: Performed at [facility], [address] [place], [place] [address]  Acetaminophen level      Status: Abnormal   Collection Time: 04/08/19  3:19 PM  Result Value Ref Range   Acetaminophen (Tylenol), Serum <10 (L) 10 - 30 ug/mL    Comment: (NOTE) Therapeutic concentrations vary significantly. A range of 10-30 ug/mL  may be an effective concentration for many patients. However, some  are best treated at concentrations outside of this range. Acetaminophen concentrations >150 ug/mL at 4 hours after ingestion  and >50 ug/mL at 12 hours after ingestion are often associated with  toxic reactions. Performed at [facility], [address] [place], [place] [address]   cbc     Status: None   Collection Time: 04/08/19  3:19 PM  Result Value Ref Range   WBC 10.0 4.0 - 10.5 K/uL   RBC 4.89 4.22 - 5.81 MIL/uL   Hemoglobin 14.7 13.0 - 17.0 g/dL   HCT 09.844.9 11.939.0 - 14.752.0 %   MCV 91.8 80.0 - 100.0 fL   MCH 30.1 26.0 - 34.0 pg   MCHC 32.7 30.0 - 36.0 g/dL   RDW 82.912.8 56.211.5 - 13.015.5 %   Platelets 358 150 - 400 K/uL   nRBC 0.0 0.0 - 0.2 %    Comment: Performed at [facility], [address] [place], [place] [address]  Urine Drug Screen, Qualitative     Status: Abnormal   Collection Time: 04/08/19  3:19 PM  Result Value Ref Range   Tricyclic, Ur Screen POSITIVE (A) NONE DETECTED   Amphetamines, Ur Screen POSITIVE (A) NONE DETECTED   MDMA (Ecstasy)Ur Screen NONE DETECTED NONE DETECTED   Cocaine Metabolite,Ur [place] POSITIVE (A) NONE DETECTED   Opiate, Ur Screen NONE DETECTED NONE DETECTED   Phencyclidine (PCP) Ur S NONE DETECTED NONE DETECTED   Cannabinoid 50 Ng, Ur [place] NONE DETECTED NONE DETECTED   Barbiturates, Ur Screen NONE DETECTED NONE DETECTED   Benzodiazepine, Ur Scrn POSITIVE (A) NONE DETECTED   Methadone Scn, Ur NONE DETECTED NONE DETECTED    Comment: (NOTE) Tricyclics + metabolites, urine    Cutoff 1000 ng/mL Amphetamines + metabolites, urine  Cutoff 1000 ng/mL MDMA (Ecstasy), urine              Cutoff 500 ng/mL Cocaine Metabolite, urine          Cutoff 300  ng/mL Opiate + metabolites, urine        Cutoff 300 ng/mL Phencyclidine (PCP), urine         Cutoff 25 ng/mL Cannabinoid, urine                 Cutoff 50 ng/mL Barbiturates + metabolites, urine  Cutoff 200 ng/mL Benzodiazepine, urine              Cutoff 200 ng/mL Methadone, urine                   Cutoff 300 ng/mL The urine drug screen provides only a preliminary, unconfirmed analytical test result and should not be used for non-medical purposes. Clinical consideration and professional judgment should be applied to any positive drug screen result due to possible interfering substances. A more specific alternate chemical method must be used in order to obtain a confirmed analytical result. Gas  chromatography / mass spectrometry (GC/MS) is the preferred confirmat ory method. Performed at [facility], [address] [place], [place] [address]     Current Facility-Administered Medications  Medication Dose Route Frequency Provider Last Rate Last Dose  . escitalopram (LEXAPRO) tablet 10 mg  10 mg Oral Daily Mariel Craft, MD      . losartan (COZAAR) tablet 50 mg  50 mg Oral Daily Mariel Craft, MD       And  . hydrochlorothiazide (MICROZIDE) capsule 12.5 mg  12.5 mg Oral Daily Mariel Craft, MD      . naproxen (NAPROSYN) tablet 500 mg  500 mg Oral BID PRN Mariel Craft, MD       Current Outpatient Medications  Medication Sig Dispense Refill  . cyclobenzaprine (FLEXERIL) 10 MG tablet Take 1/2 to 1 whole tablet by mouth every 8 hours as needed for muscle pain/spasms. 15 tablet 0  . losartan-hydrochlorothiazide (HYZAAR) 50-12.5 MG tablet Take 1 tablet by mouth daily.  1  . naproxen (NAPROSYN) 500 MG tablet Take 1 tablet (500 mg total) by mouth 2 (two) times daily as needed for moderate pain or headache. 20 tablet 0    Musculoskeletal: Strength & Muscle Tone: within normal limits Gait & Station: normal Patient leans: N/A  Psychiatric Specialty Exam: Physical  Exam  Nursing note and vitals reviewed. Constitutional: He is oriented to person, place, and time. He appears well-developed and well-nourished. He appears distressed.  HENT:  Head: Normocephalic and atraumatic.  Eyes: EOM are normal.  Neck: Normal range of motion.  Cardiovascular: Normal rate and regular rhythm.  Respiratory: Effort normal. No respiratory distress.  Musculoskeletal: Normal range of motion.  Neurological: He is alert and oriented to person, place, and time.    Review of Systems  Psychiatric/Behavioral: Positive for depression, substance abuse and suicidal ideas. Negative for hallucinations and memory loss. The patient is nervous/anxious and has insomnia.   All other systems reviewed and are negative.   Blood pressure (!) 152/84, pulse 91, temperature 98.2 F (36.8 C), temperature source Oral, resp. rate 18, height  (1.727 m), weight 83.9 kg, SpO2 97 %.Body mass index is 28.13 kg/m.  General Appearance: Fairly Groomed  Eye Contact:  Minimal  Speech:  Clear and Coherent and Normal Rate  Volume:  Decreased  Mood:  Depressed, Hopeless and Worthless  Affect:  Congruent  Thought Process:  Coherent and Linear  Orientation:  Full (Time, Place, and Person)  Thought Content:  Hallucinations: None and Rumination  Suicidal Thoughts:  Yes.  with intent/plan  Homicidal Thoughts:  No  Memory:  fair  Judgement:  Poor  Insight:  Shallow  Psychomotor Activity:  Decreased  Concentration:  Concentration: Fair and Attention Span: Fair  Recall:  [employer] of Knowledge:  Fair  Language:  Good  Akathisia:  No  Handed:  Right  AIMS (if indicated):   n/a  Assets:  Communication Skills Physical Health  ADL's:  Intact  Cognition:  WNL  Sleep:   Poor    Treatment Plan Summary: Daily contact with patient to assess and evaluate symptoms and progress in treatment, Medication management and Continue involuntary commitment  Restart past medications:  Lexapro 10 mg daily for  depression Hyzaar 50/12.5 daily for blood pressure Naprosyn 500 mg twice daily for moderate pain, headache. Continue CIWA protocol for alcohol withdrawal.  Disposition: Recommend psychiatric Inpatient admission when medically cleared.  Admission orders placed.  Mariel Craft, MD 04/08/2019 4:47 PM

## 2019-04-08 NOTE — ED Provider Notes (Signed)
[facility] Emergency Department Provider Note  ____________________________________________  Time seen: Approximately 4:06 PM  I have reviewed the triage vital signs and the nursing notes.   HISTORY  Chief Complaint Suicidal   HPI Brad Chang is a 48 y.o. male with a history of alcohol abuse, cocaine abuse, hypertension, depression who presents IVC by police for suicidal ideation.  Patient was pulled over by police today and told the cops that he was having suicidal thoughts.  The plan was to die by carbon monoxide poisoning.  Patient reports prior suicidal attempts in the past.  He was supposed to be on Zoloft however he stopped taking that several months ago.  He drinks beer regularly.  Uses cocaine.  He reports severe depression over the last few days, since Mother's Day, due to recent loss of his mother and marital problems.  Past Medical History:  Diagnosis Date  . Hypertension     Patient Active Problem List   Diagnosis Date Noted  . Suicidal ideation 04/08/2019  . Alcohol intoxication (HCC) 04/08/2019  . Cocaine abuse (HCC) 04/08/2019  . Benign essential hypertension 10/03/2018  . GAD (generalized anxiety disorder) 10/03/2018  . 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 EYE SURGERY      Prior to Admission medications   Medication Sig Start Date End Date Taking? Authorizing Provider  cyclobenzaprine (FLEXERIL) 10 MG tablet Take 1/2 to 1 whole tablet by mouth every 8 hours as needed for muscle pain/spasms. 09/28/18   Sudie Grumbling, NP  losartan-hydrochlorothiazide (HYZAAR) 50-12.5 MG tablet Take 1 tablet by mouth daily. 07/10/18   [provider]  naproxen (NAPROSYN) 500 MG tablet Take 1 tablet (500 mg total) by mouth 2 (two) times daily as needed for moderate pain or headache. 09/28/18   Sudie Grumbling, NP    Allergies Penicillins  Family History  Problem Relation Age of  Onset  . Hypertension Mother   . Cancer Father     Social History Social History   Tobacco Use  . Smoking status: Never Smoker  . Smokeless tobacco: Never Used  Substance Use Topics  . Alcohol use: Yes  . Drug use: Never    Review of Systems  Constitutional: Negative for fever. Eyes: Negative for visual changes. ENT: Negative for sore throat. Neck: No neck pain  Cardiovascular: Negative for chest pain. Respiratory: Negative for shortness of breath. Gastrointestinal: Negative for abdominal pain, vomiting or diarrhea. Genitourinary: Negative for dysuria. Musculoskeletal: Negative for back pain. Skin: Negative for rash. Neurological: Negative for headaches, weakness or numbness. Psych: + depression and SI. No HI  ____________________________________________   PHYSICAL EXAM:  VITAL SIGNS: ED Triage Vitals [04/08/19 1515]  Enc Vitals Group     BP (!) 152/84     Pulse Rate 91     Resp 18     Temp 98.2 F (36.8 C)     Temp Source Oral     SpO2 97 %     Weight 185 lb (83.9 kg)     Height 5\' 8"  (1.727 m)     Head Circumference      Peak Flow      Pain Score 0     Pain Loc      Pain Edu?      Excl. in GC?     Constitutional: Alert and oriented. Well appearing and in no apparent distress. HEENT:      Head: Normocephalic  and atraumatic.         Eyes: Conjunctivae are normal. Sclera is non-icteric.       Mouth/Throat: Mucous membranes are moist.       Neck: Supple with no signs of meningismus. Cardiovascular: Regular rate and rhythm. No murmurs, gallops, or rubs. 2+ symmetrical distal pulses are present in all extremities. No JVD. Respiratory: Normal respiratory effort. Lungs are clear to auscultation bilaterally. No wheezes, crackles, or rhonchi.  Gastrointestinal: Soft, non tender, and non distended with positive bowel sounds. No rebound or guarding. Musculoskeletal: Nontender with normal range of motion in all extremities. No edema, cyanosis, or erythema of  extremities. Neurologic: Normal speech and language. Face is symmetric. Moving all extremities. No gross focal neurologic deficits are appreciated. Skin: Skin is warm, dry and intact. No rash noted. Psychiatric: Mood and affect are normal. Speech and behavior are normal.  ____________________________________________   LABS (all labs ordered are listed, but only abnormal results are displayed)  Labs Reviewed  COMPREHENSIVE METABOLIC PANEL - Abnormal; Notable for the following components:      Result Value   Calcium 8.8 (*)    AST 107 (*)    ALT 124 (*)    Total Bilirubin 0.2 (*)    All other components within normal limits  ETHANOL - Abnormal; Notable for the following components:   Alcohol, Ethyl (B) 123 (*)    All other components within normal limits  ACETAMINOPHEN LEVEL - Abnormal; Notable for the following components:   Acetaminophen (Tylenol), Serum <10 (*)    All other components within normal limits  URINE DRUG SCREEN, QUALITATIVE ([facility] ONLY) - Abnormal; Notable for the following components:   Tricyclic, Ur Screen POSITIVE (*)    Amphetamines, Ur Screen POSITIVE (*)    Cocaine Metabolite,Ur [place] POSITIVE (*)    Benzodiazepine, Ur Scrn POSITIVE (*)    All other components within normal limits  SARS CORONAVIRUS 2 (HOSPITAL ORDER, PERFORMED IN [facility])  SALICYLATE LEVEL  CBC   ____________________________________________  EKG  none  ____________________________________________  RADIOLOGY  none  ____________________________________________   PROCEDURES  Procedure(s) performed: None Procedures Critical Care performed:  None ____________________________________________   INITIAL IMPRESSION / ASSESSMENT AND PLAN / ED COURSE  48 y.o. male with a history of alcohol abuse, cocaine abuse, hypertension, depression who presents IVC by police for suicidal ideation.  Patient will be maintain IVC.  Psychiatry will be consulted.  Labs for medical  clearance showing alcohol level of 123, slightly elevated LFTs most likely due to alcohol abuse, tox screen positive for tricyclics, amphetamine, cocaine, and benzos. CIWA protocol initiated. Patient medically cleared awaiting psych evaluation      As part of my medical decision making, I reviewed the following data within the electronic MEDICAL RECORD NUMBER Nursing notes reviewed and incorporated, Labs reviewed , Old chart reviewed, A consult was requested and obtained from this/these consultant(s) Psychiatry, Notes from prior ED visits and [place] Controlled Substance Database    Pertinent labs & imaging results that were available during my care of the patient were reviewed by me and considered in my medical decision making (see chart for details).    ____________________________________________   FINAL CLINICAL IMPRESSION(S) / ED DIAGNOSES  Final diagnoses:  Polysubstance abuse (HCC)  Alcohol abuse  Cocaine abuse (HCC)  Suicidal ideation  Severe episode of recurrent major depressive disorder, without psychotic features (HCC)      NEW MEDICATIONS STARTED DURING THIS VISIT:  ED Discharge Orders    None  Note:  This document was prepared using Dragon voice recognition software and may include unintentional dictation errors.    Don Perking, [place], MD 04/08/19 [phone]

## 2019-04-08 NOTE — BH Assessment (Signed)
Assessment Note  Brad Chang is an 48 y.o. male who presented to the ED with law enforcement under IVC with a plan for suicide.   Upon assessment, "Brad Chang" was alert and oriented.  He stated he was here today because "I've been having a lot of issues" .Marland Kitchen. "Mother's Day was really hard for me.. I had a break down." He had recently received his meal at the time and was eating it quite quickly stating he was hungry and hadn't eaten since Thursday.  Brad Chang initially stated he was sleeping "here and there" in motels, and upon further assessment, it was learned that he was living with his wife and recently evacuated the marital residence.   When TTS inquired about suicidal ideation, Brad Chang stated, "I tried today and it didn't work."  He stated he had a plan to put an exhaust house on his engine and put it into his truck.  He was trying to send a text message to his daughter when he "got pulled by the popo" and charged with careless and reckless driving.   He denies HI, AVH or intentional self-injury. Brad Chang's mother passed away in January 2020. Brad Chang reports a history of being prescribed Zoloft and other unknown psychotropic medications. He denies historical psychiatric hospital admissions or substance use treatment.  However, there is a history of substance use and about 10 years reported recovery.  Alcohol use was initiated at age 89 when he stole it from the refrigerator.  Lately he has been drinking 18 Bud Lights daily; use has escalated over the past several years with last use in the last 24 hours.  Patient reports periodic cocaine use with last use about 48 hours ago.  Patient stated "I feel like a piece of shit" and reports experiencing a lot of "pressure."  He reports tearfulness, isolation, feeling worthless. Patient reports sleep poor - and he hasn't been to sleep in several days.  He also has not eaten since Thursday.  Patient agreed that inpatient admission seems like a good plan currently.    Collateral information from his wife Brad Chang: Brad Chang has been suicidal for a long time.  He has been using "a lot of drugs".  Specifically, cocaine use: "It was to the point where he was stealing, taking and selling everything at the house - anything he can get his hands on." He has been spending thousands and thousands of dollars and borrowing money from friends. "He stole two valiums" and has used other benzodiazepines and opiate. There was one time where he put a gun to his head before when he was in the [place] sitting at his father's grave.   Diagnosis: Suicidal Ideation/Depressive Episode; Substance Use Disorder (Alcohol, Cocaine)   Past Medical History:  Past Medical History:  Diagnosis Date  . Hypertension     Past Surgical History:  Procedure Laterality Date  . EYE SURGERY      Family History:  Family History  Problem Relation Age of Onset  . Hypertension Mother   . Cancer Father     Social History:  reports that he has never smoked. He has never used smokeless tobacco. He reports current alcohol use. He reports that he does not use drugs.  Additional Social History:  Alcohol / Drug Use Pain Medications: See PTA Prescriptions: See PTA Over the Counter: See PTA History of alcohol / drug use?: Yes Longest period of sobriety (when/how long): about 10 years Negative Consequences of Use: Personal relationships Substance #1 Name of Substance 1:  ALCOHOL 1 - Age of First Use: 17 1 - Amount (size/oz): 18 cans per day 1 - Frequency: daily 1 - Duration: off and on for several years 1 - Last Use / Amount: a case of beer in the last 24 hours Substance #2 Name of Substance 2: COCAINE 2 - Age of First Use: 17 2 - Last Use / Amount: 2 days ago, powder, unknown amount Substance #3 Name of Substance 3: VARIOUS  CIWA: CIWA-Ar BP: (!) 152/84 Pulse Rate: 91 Nausea and Vomiting: no nausea and no vomiting Tactile Disturbances: none Tremor: no tremor Auditory  Disturbances: not present Paroxysmal Sweats: no sweat visible Visual Disturbances: not present Anxiety: mildly anxious Headache, Fullness in Head: none present Agitation: normal activity Orientation and Clouding of Sensorium: oriented and can do serial additions CIWA-Ar Total: 1 COWS:    Allergies:  Allergies  Allergen Reactions  . Penicillins Other (See Comments)    Home Medications: (Not in a hospital admission)   OB/GYN Status:  No LMP for male patient.  General Assessment Data Location of Assessment: [facility] ED TTS Assessment: In system Is this a Tele or Face-to-Face Assessment?: Face-to-Face Is this an Initial Assessment or a Re-assessment for this encounter?: Initial Assessment Patient Accompanied by:: N/A Language Other than English: No Living Arrangements: Other (Comment)(hotels currelty; left partner) What gender do you identify as?: Male Marital status: Married Pregnancy Status: No Living Arrangements: Other (Comment)(hotels) Can pt return to current living arrangement?: Yes Admission Status: Involuntary Petitioner: Police Is patient capable of signing voluntary admission?: No Referral Source: Self/Family/Friend Insurance type: none  Medical Screening Exam [facility] Walk-in ONLY) Medical Exam completed: Yes  Crisis Care Plan Living Arrangements: Other (Comment)(hotels)  Education Status Is patient currently in school?: No Is the patient employed, unemployed or receiving disability?: Unemployed  Risk to self with the past 6 months Suicidal Ideation: Yes-Currently Present Has patient been a risk to self within the past 6 months prior to admission? : Yes Suicidal Intent: Yes-Currently Present Has patient had any suicidal intent within the past 6 months prior to admission? : Yes Is patient at risk for suicide?: Yes Suicidal Plan?: Yes-Currently Present Has patient had any suicidal plan within the past 6 months prior to admission? : Yes Specify Current Suicidal  Plan: asphyxiation Access to Means: Yes Specify Access to Suicidal Means: vehicle - CO poisoning What has been your use of drugs/alcohol within the last 12 months?: daily alcohol use; other drug use Previous Attempts/Gestures: Yes How many times?: 1 Other Self Harm Risks: ongoing substance use Triggers for Past Attempts: Unpredictable Intentional Self Injurious Behavior: None Family Suicide History: Unknown Recent stressful life event(s): Job Loss, Conflict (Comment)(marital conflict) Persecutory voices/beliefs?: No Depression: Yes Depression Symptoms: Despondent, Tearfulness, Isolating, Loss of interest in usual pleasures, Feeling worthless/self pity, Feeling angry/irritable Substance abuse history and/or treatment for substance abuse?: Yes Suicide prevention information given to non-admitted patients: Not applicable  Risk to Others within the past 6 months Homicidal Ideation: No Does patient have any lifetime risk of violence toward others beyond the six months prior to admission? : No Thoughts of Harm to Others: No Current Homicidal Intent: No Current Homicidal Plan: No Access to Homicidal Means: No Assessment of Violence: None Noted Does patient have access to weapons?: Yes (Comment) Criminal Charges Pending?: Yes Describe Pending Criminal Charges: careless and reckless driving Does patient have a court date: Yes Court Date: 06/27/19 Is patient on probation?: No  Psychosis Hallucinations: None noted Delusions: None noted  Mental Status Report Appearance/Hygiene:  In scrubs, Poor hygiene Eye Contact: Fair Motor Activity: Freedom of movement, Unremarkable Speech: Logical/coherent Level of Consciousness: Alert Mood: Depressed, Apathetic, Ashamed/humiliated, Worthless, low self-esteem Affect: Appropriate to circumstance, Sad, Apathetic Anxiety Level: Minimal Thought Processes: Coherent, Relevant Judgement: Impaired Orientation: Person, Place, Time, Appropriate for  developmental age, Situation Obsessive Compulsive Thoughts/Behaviors: None  Cognitive Functioning Concentration: Fair Memory: Recent Intact, Remote Intact Is patient IDD: No Insight: Poor Impulse Control: Poor Appetite: Poor Have you had any weight changes? : No Change Sleep: Decreased Total Hours of Sleep: (UTA) Vegetative Symptoms: Staying in bed  ADLScreening [facility] Assessment Services) Patient's cognitive ability adequate to safely complete daily activities?: Yes Patient able to express need for assistance with ADLs?: Yes Independently performs ADLs?: Yes (appropriate for developmental age)  Prior Inpatient Therapy Prior Inpatient Therapy: No  Prior Outpatient Therapy Prior Outpatient Therapy: No Does patient have an ACCT team?: No Does patient have Intensive In-House Services?  : No Does patient have [facility] services? : No Does patient have P4CC services?: No  ADL Screening (condition at time of admission) Patient's cognitive ability adequate to safely complete daily activities?: Yes Is the patient deaf or have difficulty hearing?: No Does the patient have difficulty seeing, even when wearing glasses/contacts?: No Does the patient have difficulty concentrating, remembering, or making decisions?: No Patient able to express need for assistance with ADLs?: Yes Does the patient have difficulty dressing or bathing?: No Independently performs ADLs?: Yes (appropriate for developmental age) Does the patient have difficulty walking or climbing stairs?: No Weakness of Legs: None Weakness of Arms/Hands: None  Home Assistive Devices/Equipment Home Assistive Devices/Equipment: None  Therapy Consults (therapy consults require a physician order) PT Evaluation Needed: No OT Evalulation Needed: No SLP Evaluation Needed: No Abuse/Neglect Assessment (Assessment to be complete while patient is alone) Abuse/Neglect Assessment Can Be Completed: Yes Physical Abuse: Denies Verbal  Abuse: Denies Sexual Abuse: Denies Exploitation of patient/patient's resources: Denies Self-Neglect: Denies Values / Beliefs Cultural Requests During Hospitalization: None Spiritual Requests During Hospitalization: None Consults Spiritual Care Consult Needed: No Social Work Consult Needed: No Merchant navy officer (For Healthcare) Does Patient Have a Medical Advance Directive?: No          Disposition:  Disposition Initial Assessment Completed for this Encounter: Yes Patient referred to: Other (Comment)([facility])  On Site Evaluation by:  Dr. Thereasa Distance [facility] 04/08/2019 5:39 PM

## 2019-04-08 NOTE — ED Notes (Signed)
IVC prior to arrival  

## 2019-04-08 NOTE — ED Notes (Signed)
IVC prior to arrival/ Psych Consult ordered

## 2019-04-08 NOTE — BH Assessment (Signed)
Patient is to be admitted to [facility] by Dr. Viviano Simas.  Attending Physician will be Dr. Toni Amend.   Patient has been assigned to room 310, by [facility] Charge Nurse Shatara.     ER staff is aware of the admission:  Glenda, ER Secretary    Dr. Don Perking, ER MD   Prudencio Burly, Patient's Nurse   [place] Patient Access.

## 2019-04-08 NOTE — ED Notes (Signed)
This EDT gave pt water

## 2019-04-08 NOTE — ED Triage Notes (Signed)
Pt brought in by [place] sheriff dept.  Pt is IVC. Pt states SI   Denies HI.  Pt reports etoh use today.  Denies drug use.  Pt calm and cooperative.

## 2019-04-08 NOTE — ED Notes (Signed)
Pt resting, meal tray placed in rm.

## 2019-04-09 DIAGNOSIS — F1010 Alcohol abuse, uncomplicated: Secondary | ICD-10-CM

## 2019-04-09 DIAGNOSIS — F322 Major depressive disorder, single episode, severe without psychotic features: Secondary | ICD-10-CM

## 2019-04-09 MED ORDER — MIRTAZAPINE 15 MG PO TABS
15.0000 mg | ORAL_TABLET | Freq: Every day | ORAL | Status: DC
  Administered 2019-04-09: 15 mg via ORAL
  Filled 2019-04-09: qty 1

## 2019-04-09 MED ORDER — NICOTINE 7 MG/24HR TD PT24
7.0000 mg | MEDICATED_PATCH | Freq: Every day | TRANSDERMAL | Status: DC
  Administered 2019-04-09 – 2019-04-16 (×8): 7 mg via TRANSDERMAL
  Filled 2019-04-09 (×8): qty 1

## 2019-04-09 NOTE — Progress Notes (Signed)
Recreation Therapy Notes          Jarissa Sheriff 04/09/2019 12:04 PM

## 2019-04-09 NOTE — Progress Notes (Addendum)
D - Patient was received from [facility] Emergency Department at 1820. Patient was pleasant during assessment. Patient stated he was here because he had been drinking a lot lately but said "I have just had a lot on my mind lately and I have been drinking a lot to deal with everything." After assessment the patient was oriented to the unit and his room and he wanted to just lay down and get some sleep.   A - Patient didn't have any medication scheduled this evening but was compliant with procedures on the unit. Patient was given education. Patient was given support and encouragement to be active in his treatment plan. Patient informed to let staff know if there are any issues or problems on the unit.   R - Patient being monitored Q 15 minutes for safety per unit protocol. Patient remains safe on the unit.   Patient had an elevated BP this morning, his morning BP medication (See MAR) administered per MD orders.

## 2019-04-09 NOTE — H&P (Signed)
Psychiatric Admission Assessment Adult  Patient Identification: Brad Chang MRN:  [id number] Date of Evaluation:  04/09/2019 Chief Complaint:  Suicidal Principal Diagnosis: Severe major depression, single episode, without psychotic features (HCC) Diagnosis:  Principal Problem:   Severe major depression, single episode, without psychotic features (HCC) Active Problems:   Blindness of left eye   Benign essential hypertension   Cocaine abuse (HCC)   Suicidal behavior   Alcohol abuse  History of Present Illness: Patient seen chart reviewed.  Patient was brought to the emergency room by police after being pulled over for driving erratically.  He told them that he was having suicidal thoughts.  He tells me that he had bought a length of pipe with the intention of suffocating himself by carbon monoxide.  Patient continues to endorse suicidal thoughts and he has been thinking about it for a while.  Mood is been depressed for a couple years but has rapidly been getting worse.  Feels overwhelmed by stresses.  He and his wife split up last Friday.  He has been drinking heavily about 12-14 beers a day plus using cocaine.  Patient says that he has been compliant at least intermittently with medicine given to him by his primary care doctor but his mood is not improved.  His sleep habits are poor and erratic.  So her eating habits.  No specific other new medical problems no homicidal ideation no hallucinations or psychotic symptoms. Associated Signs/Symptoms: Depression Symptoms:  depressed mood, anhedonia, feelings of worthlessness/guilt, difficulty concentrating, suicidal thoughts with specific plan, (Hypo) Manic Symptoms:  Distractibility, Anxiety Symptoms:  Excessive Worry, Psychotic Symptoms:  None reported PTSD Symptoms: Negative Total Time spent with patient: 1 hour  Past Psychiatric History: Patient has no previous hospitalizations.  He says that he did have a suicide attempt last year  where he took a gun to the [place] where his parents are buried.  His family intervened and he was not hospitalized.  He has seen his primary care doctor at [facility] and been prescribed antidepressants so far without any clear improvement although he has been drinking and using drugs during the same time.  Is the patient at risk to self? Yes.    Has the patient been a risk to self in the past 6 months? Yes.    Has the patient been a risk to self within the distant past? Yes.    Is the patient a risk to others? No.  Has the patient been a risk to others in the past 6 months? No.  Has the patient been a risk to others within the distant past? No.   Prior Inpatient Therapy:   Prior Outpatient Therapy:    Alcohol Screening: 1. How often do you have a drink containing alcohol?: 2 to 3 times a week 2. How many drinks containing alcohol do you have on a typical day when you are drinking?: 3 or 4 3. How often do you have six or more drinks on one occasion?: Less than monthly AUDIT-C Score: 5 4. How often during the last year have you found that you were not able to stop drinking once you had started?: Never 5. How often during the last year have you failed to do what was normally expected from you becasue of drinking?: Never 6. How often during the last year have you needed a first drink in the morning to get yourself going after a heavy drinking session?: Never 7. How often during the last year have you had a  feeling of guilt of remorse after drinking?: Never 8. How often during the last year have you been unable to remember what happened the night before because you had been drinking?: Never 9. Have you or someone else been injured as a result of your drinking?: No 10. Has a relative or friend or a doctor or another health worker been concerned about your drinking or suggested you cut down?: No Alcohol Use Disorder Identification Test Final Score (AUDIT): 5 Alcohol Brief  Interventions/Follow-up: AUDIT Score <7 follow-up not indicated Substance Abuse History in the last 12 months:  Yes.   Consequences of Substance Abuse: Medical Consequences:  Worsening self-care worsening medication compliance worsening depression Previous Psychotropic Medications: Yes  Psychological Evaluations: No  Past Medical History:  Past Medical History:  Diagnosis Date  . Hypertension     Past Surgical History:  Procedure Laterality Date  . EYE SURGERY     Family History:  Family History  Problem Relation Age of Onset  . Hypertension Mother   . Cancer Father    Family Psychiatric  History: His mother had depression.  No family history of suicide. Tobacco Screening: Have you used any form of tobacco in the last 30 days? (Cigarettes, Smokeless Tobacco, Cigars, and/or Pipes): Yes Tobacco use, Select all that apply: smokeless tobacco use daily Are you interested in Tobacco Cessation Medications?: No, patient refused Counseled patient on smoking cessation including recognizing danger situations, developing coping skills and basic information about quitting provided: Refused/Declined practical counseling Social History:  Social History   Substance and Sexual Activity  Alcohol Use Yes     Social History   Substance and Sexual Activity  Drug Use Never    Additional Social History: Marital status: Married Number of Years Married: 11 What types of issues is patient dealing with in the relationship?: Pt reports "I work 5-7 days a week, 10-12 hours a day.  It's mainly women stuff like not doing something".  Are you sexually active?: Yes What is your sexual orientation?: Heterosexual "my wife" Has your sexual activity been affected by drugs, alcohol, medication, or emotional stress?: Pt reports "probably". Does patient have children?: Yes How many children?: 2 How is patient's relationship with their children?: Pt rpeorts "it's pretty good.  We talk everyday."                          Allergies:   Allergies  Allergen Reactions  . Penicillins Other (See Comments)   Lab Results:  Results for orders placed or performed during the hospital encounter of 04/08/19 (from the past 48 hour(s))  Comprehensive metabolic panel     Status: Abnormal   Collection Time: 04/08/19  3:19 PM  Result Value Ref Range   Sodium 141 135 - 145 mmol/L   Potassium 4.3 3.5 - 5.1 mmol/L   Chloride 105 98 - 111 mmol/L   CO2 24 22 - 32 mmol/L   Glucose, Bld 94 70 - 99 mg/dL   BUN 7 6 - 20 mg/dL   Creatinine, Ser 1.61 0.61 - 1.24 mg/dL   Calcium 8.8 (L) 8.9 - 10.3 mg/dL   Total Protein 7.9 6.5 - 8.1 g/dL   Albumin 4.5 3.5 - 5.0 g/dL   AST 096 (H) 15 - 41 U/L   ALT 124 (H) 0 - 44 U/L   Alkaline Phosphatase 93 38 - 126 U/L   Total Bilirubin 0.2 (L) 0.3 - 1.2 mg/dL   GFR calc non Af Amer >60 >  60 mL/min   GFR calc Af Amer >60 >60 mL/min   Anion gap 12 5 - 15    Comment: Performed at [facility], [address] [place], [place] [address]  Ethanol     Status: Abnormal   Collection Time: 04/08/19  3:19 PM  Result Value Ref Range   Alcohol, Ethyl (B) 123 (H) <10 mg/dL    Comment: (NOTE) Lowest detectable limit for serum alcohol is 10 mg/dL. For medical purposes only. Performed at [facility], [address] [place], [place] [address]   Salicylate level     Status: None   Collection Time: 04/08/19  3:19 PM  Result Value Ref Range   Salicylate Lvl <7.0 2.8 - 30.0 mg/dL    Comment: Performed at [facility], [address] [place], [place] [address]  Acetaminophen level     Status: Abnormal   Collection Time: 04/08/19  3:19 PM  Result Value Ref Range   Acetaminophen (Tylenol), Serum <10 (L) 10 - 30 ug/mL    Comment: (NOTE) Therapeutic concentrations vary significantly. A range of 10-30 ug/mL  may be an effective concentration for many patients. However, some  are best treated at concentrations outside of this range. Acetaminophen  concentrations >150 ug/mL at 4 hours after ingestion  and >50 ug/mL at 12 hours after ingestion are often associated with  toxic reactions. Performed at [facility], [address] [place], [place] [address]   cbc     Status: None   Collection Time: 04/08/19  3:19 PM  Result Value Ref Range   WBC 10.0 4.0 - 10.5 K/uL   RBC 4.89 4.22 - 5.81 MIL/uL   Hemoglobin 14.7 13.0 - 17.0 g/dL   HCT 29.544.9 62.139.0 - 30.852.0 %   MCV 91.8 80.0 - 100.0 fL   MCH 30.1 26.0 - 34.0 pg   MCHC 32.7 30.0 - 36.0 g/dL   RDW 65.712.8 84.611.5 - 96.215.5 %   Platelets 358 150 - 400 K/uL   nRBC 0.0 0.0 - 0.2 %    Comment: Performed at [facility], [address] [place], [place] [address]  Urine Drug Screen, Qualitative     Status: Abnormal   Collection Time: 04/08/19  3:19 PM  Result Value Ref Range   Tricyclic, Ur Screen POSITIVE (A) NONE DETECTED   Amphetamines, Ur Screen POSITIVE (A) NONE DETECTED   MDMA (Ecstasy)Ur Screen NONE DETECTED NONE DETECTED   Cocaine Metabolite,Ur [place] POSITIVE (A) NONE DETECTED   Opiate, Ur Screen NONE DETECTED NONE DETECTED   Phencyclidine (PCP) Ur S NONE DETECTED NONE DETECTED   Cannabinoid 50 Ng, Ur [place] NONE DETECTED NONE DETECTED   Barbiturates, Ur Screen NONE DETECTED NONE DETECTED   Benzodiazepine, Ur Scrn POSITIVE (A) NONE DETECTED   Methadone Scn, Ur NONE DETECTED NONE DETECTED    Comment: (NOTE) Tricyclics + metabolites, urine    Cutoff 1000 ng/mL Amphetamines + metabolites, urine  Cutoff 1000 ng/mL MDMA (Ecstasy), urine              Cutoff 500 ng/mL Cocaine Metabolite, urine          Cutoff 300 ng/mL Opiate + metabolites, urine        Cutoff 300 ng/mL Phencyclidine (PCP), urine         Cutoff 25 ng/mL Cannabinoid, urine                 Cutoff 50 ng/mL Barbiturates + metabolites, urine  Cutoff 200 ng/mL Benzodiazepine, urine  Cutoff 200 ng/mL Methadone, urine                   Cutoff 300 ng/mL The urine drug screen provides only a preliminary,  unconfirmed analytical test result and should not be used for non-medical purposes. Clinical consideration and professional judgment should be applied to any positive drug screen result due to possible interfering substances. A more specific alternate chemical method must be used in order to obtain a confirmed analytical result. Gas chromatography / mass spectrometry (GC/MS) is the preferred confirmat ory method. Performed at [facility], [address] [place], [place] [address]   SARS Coronavirus 2 (CEPHEID - Performed in [facility] hospital lab), Hosp Order     Status: None   Collection Time: 04/08/19  5:00 PM  Result Value Ref Range   SARS Coronavirus 2 NEGATIVE NEGATIVE    Comment: (NOTE) If result is NEGATIVE SARS-CoV-2 target nucleic acids are NOT DETECTED. The SARS-CoV-2 RNA is generally detectable in upper and lower  respiratory specimens during the acute phase of infection. The lowest  concentration of SARS-CoV-2 viral copies this assay can detect is 250  copies / mL. A negative result does not preclude SARS-CoV-2 infection  and should not be used as the sole basis for treatment or other  patient management decisions.  A negative result may occur with  improper specimen collection / handling, submission of specimen other  than nasopharyngeal swab, presence of viral mutation(s) within the  areas targeted by this assay, and inadequate number of viral copies  (<250 copies / mL). A negative result must be combined with clinical  observations, patient history, and epidemiological information. If result is POSITIVE SARS-CoV-2 target nucleic acids are DETECTED. The SARS-CoV-2 RNA is generally detectable in upper and lower  respiratory specimens dur ing the acute phase of infection.  Positive  results are indicative of active infection with SARS-CoV-2.  Clinical  correlation with patient history and other diagnostic information is  necessary to determine patient  infection status.  Positive results do  not rule out bacterial infection or co-infection with other viruses. If result is PRESUMPTIVE POSTIVE SARS-CoV-2 nucleic acids MAY BE PRESENT.   A presumptive positive result was obtained on the submitted specimen  and confirmed on repeat testing.  While 2019 novel coronavirus  (SARS-CoV-2) nucleic acids may be present in the submitted sample  additional confirmatory testing may be necessary for epidemiological  and / or clinical management purposes  to differentiate between  SARS-CoV-2 and other Sarbecovirus currently known to infect humans.  If clinically indicated additional testing with an alternate test  methodology [phone]) is advised. The SARS-CoV-2 RNA is generally  detectable in upper and lower respiratory sp ecimens during the acute  phase of infection. The expected result is Negative. Fact Sheet for Patients:  BoilerBrush.com.cy Fact Sheet for Healthcare Providers: https://pope.com/ This test is not yet approved or cleared by the [place] FDA and has been authorized for detection and/or diagnosis of SARS-CoV-2 by FDA under an Emergency Use Authorization (EUA).  This EUA will remain in effect (meaning this test can be used) for the duration of the COVID-19 declaration under Section 564(b)(1) of the Act, 21 U.S.C. section 360bbb-3(b)(1), unless the authorization is terminated or revoked sooner. Performed at [facility], [address], [place], [place] [address]     Blood Alcohol level:  Lab Results  Component Value Date   ETH 123 (H) 04/08/2019    Metabolic Disorder Labs:  No results  found for: HGBA1C, MPG No results found for: PROLACTIN No results found for: CHOL, TRIG, HDL, CHOLHDL, VLDL, LDLCALC  Current Medications: Current Facility-Administered Medications  Medication Dose Route Frequency Provider Last Rate Last Dose  . acetaminophen (TYLENOL) tablet 650  mg  650 mg Oral Q6H PRN Mariel Craft, MD      . alum & mag hydroxide-simeth (MAALOX/MYLANTA) 200-200-20 MG/5ML suspension 30 mL  30 mL Oral Q4H PRN Mariel Craft, MD      . diphenhydrAMINE (BENADRYL) capsule 50 mg  50 mg Oral Q6H PRN Mariel Craft, MD      . losartan (COZAAR) tablet 50 mg  50 mg Oral Daily Mariel Craft, MD   50 mg at 04/09/19 1610   And  . hydrochlorothiazide (MICROZIDE) capsule 12.5 mg  12.5 mg Oral Daily Mariel Craft, MD   12.5 mg at 04/09/19 9604  . hydrOXYzine (ATARAX/VISTARIL) tablet 25 mg  25 mg Oral TID PRN Mariel Craft, MD      . magnesium hydroxide (MILK OF MAGNESIA) suspension 30 mL  30 mL Oral Daily PRN Mariel Craft, MD      . mirtazapine (REMERON) tablet 15 mg  15 mg Oral QHS Clapacs, John T, MD      . naproxen (NAPROSYN) tablet 500 mg  500 mg Oral BID PRN Mariel Craft, MD      . nicotine (NICODERM CQ - dosed in mg/24 hr) patch 7 mg  7 mg Transdermal Daily Clapacs, Jackquline Denmark, MD   7 mg at 04/09/19 5409  . OLANZapine zydis (ZYPREXA) disintegrating tablet 10 mg  10 mg Oral Q8H PRN Mariel Craft, MD       And  . ziprasidone (GEODON) injection 20 mg  20 mg Intramuscular PRN Mariel Craft, MD       PTA Medications: Medications Prior to Admission  Medication Sig Dispense Refill Last Dose  . cyclobenzaprine (FLEXERIL) 10 MG tablet Take 1/2 to 1 whole tablet by mouth every 8 hours as needed for muscle pain/spasms. (Patient not taking: Reported on 04/08/2019) 15 tablet 0 Completed Course at Unknown time  . naproxen (NAPROSYN) 500 MG tablet Take 1 tablet (500 mg total) by mouth 2 (two) times daily as needed for moderate pain or headache. (Patient not taking: Reported on 04/08/2019) 20 tablet 0 Completed Course at Unknown time    Musculoskeletal: Strength & Muscle Tone: within normal limits Gait & Station: normal Patient leans: N/A  Psychiatric Specialty Exam: Physical Exam  Nursing note and vitals reviewed. Constitutional: He appears  well-developed and well-nourished.  HENT:  Head: Normocephalic and atraumatic.  Eyes: Pupils are equal, round, and reactive to light. Conjunctivae are normal.  Neck: Normal range of motion.  Cardiovascular: Regular rhythm and normal heart sounds.  Respiratory: Effort normal. No respiratory distress.  GI: Soft.  Musculoskeletal: Normal range of motion.  Neurological: He is alert.  Skin: Skin is warm and dry.  Psychiatric: His affect is blunt. His speech is delayed. He is slowed. Cognition and memory are impaired. He expresses impulsivity. He exhibits a depressed mood. He expresses suicidal ideation. He expresses suicidal plans.    Review of Systems  Constitutional: Negative.   HENT: Negative.   Eyes: Negative.   Respiratory: Negative.   Cardiovascular: Negative.   Gastrointestinal: Negative.   Musculoskeletal: Negative.   Skin: Negative.   Neurological: Negative.   Psychiatric/Behavioral: Positive for depression, hallucinations, memory loss, substance abuse and suicidal ideas. The patient is nervous/anxious and has  insomnia.     Blood pressure 139/69, pulse 68, temperature 98.1 F (36.7 C), temperature source Oral, resp. rate 17, height  (1.727 m), weight 83.9 kg, SpO2 98 %.Body mass index is 28.12 kg/m.  General Appearance: Disheveled  Eye Contact:  Minimal  Speech:  Blocked  Volume:  Decreased  Mood:  Depressed and Hopeless  Affect:  Constricted  Thought Process:  Coherent  Orientation:  Full (Time, Place, and Person)  Thought Content:  Rumination  Suicidal Thoughts:  Yes.  with intent/plan  Homicidal Thoughts:  No  Memory:  Immediate;   Fair Recent;   Fair Remote;   Fair  Judgement:  Impaired  Insight:  Shallow  Psychomotor Activity:  Decreased  Concentration:  Concentration: Poor  Recall:  [employer] of Knowledge:  Fair  Language:  Fair  Akathisia:  No  Handed:  Right  AIMS (if indicated):     Assets:  Desire for Improvement Resilience  ADL's:  Impaired   Cognition:  Impaired,  Mild  Sleep:  Number of Hours: 7.75    Treatment Plan Summary: Daily contact with patient to assess and evaluate symptoms and progress in treatment, Medication management and Plan Patient presents with severe major depression without psychotic symptoms as well as active alcohol and drug abuse.  Right now not showing any signs of active alcohol withdrawal.  No history of DTs or seizures.  Nevertheless we will monitor the withdrawal parameters at least for a few days.  Meanwhile I suggest changing his antidepressant from Lexapro to mirtazapine to take advantage of the sleep and appetite side effects.  Patient is agreeable.  Monitor blood pressure which is still elevated and getting back on his blood pressure medicine.  Engage in individual and group therapy.  Reassess suicidality.  Start talking with the patient about the possibility of inpatient substance abuse treatment versus outpatient.  Observation Level/Precautions:  15 minute checks  Laboratory:  Chemistry Profile  Psychotherapy:    Medications:    Consultations:    Discharge Concerns:    Estimated LOS:  Other:     Physician Treatment Plan for Primary Diagnosis: Severe major depression, single episode, without psychotic features (HCC) Long Term Goal(s): Improvement in symptoms so as ready for discharge  Short Term Goals: Ability to disclose and discuss suicidal ideas, Ability to demonstrate self-control will improve and Ability to identify and develop effective coping behaviors will improve  Physician Treatment Plan for Secondary Diagnosis: Principal Problem:   Severe major depression, single episode, without psychotic features (HCC) Active Problems:   Blindness of left eye   Benign essential hypertension   Cocaine abuse (HCC)   Suicidal behavior   Alcohol abuse  Long Term Goal(s): Improvement in symptoms so as ready for discharge  Short Term Goals: Ability to identify triggers associated with substance  abuse/mental health issues will improve  I certify that inpatient services furnished can reasonably be expected to improve the patient's condition.    Mordecai Rasmussen, MD 5/14/202012:46 PM

## 2019-04-09 NOTE — Plan of Care (Signed)
Patient is alert X 3; denies SI, HI and AVH. This morning patient isolates to his room, did not want to eat any breakfast. Patient did eat lunch, no tremors, nausea or vomiting observed. Patient takes medication without any issue. Patient denies pain this morning 0/10. Patient not attending any groups this morning, affect is flat but pleasant. Patient continued to isolate most of the day.

## 2019-04-09 NOTE — Tx Team (Signed)
Initial Treatment Plan 04/09/2019 12:42 AM Lum Keas Blower ONG:[id number]    PATIENT STRESSORS: Medication change or noncompliance Substance abuse   PATIENT STRENGTHS: Motivation for treatment/growth Supportive family/friends   PATIENT IDENTIFIED PROBLEMS: Suicidal Ideation  Depression  Anxiety                 DISCHARGE CRITERIA:  Improved stabilization in mood, thinking, and/or behavior Verbal commitment to aftercare and medication compliance  PRELIMINARY DISCHARGE PLAN: Outpatient therapy Return to previous living arrangement  PATIENT/FAMILY INVOLVEMENT: This treatment plan has been presented to and reviewed with the patient, Brad Chang. The patient has been given the opportunity to ask questions and make suggestions.  Elmyra Ricks, RN 04/09/2019, 12:42 AM

## 2019-04-09 NOTE — B[facility] Counselor (Signed)
CSW has faxed the referral to [facility] and [facility] for authorization number.   CSW received confirmation that both faxes were successful.   Penni Homans, MSW, LCSW 04/09/2019 1:36 PM

## 2019-04-09 NOTE — B[facility] Counselor (Signed)
CSW spoke with the patient's wife who reports that the patient is "mean, manic, outbursts, he stays up 3-4 days, he sleeps for 3-4 days. He takes Adderall, Cocaine and stolen my Flexaril.  I haven't seen him since last Thursday because I asked him not to come home drunk off that white liquor."  Wife reports "I think he is Bipolar and in need of inpatient."  CSW informed that the patient is requesting a [facility] referral at this time.   Penni Homans, MSW, LCSW 04/09/2019 3:47 PM
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
## 2019-04-09 NOTE — [facility] Suicide Risk Assessment (Signed)
[facility] INPATIENT:  Family/Significant Other Suicide Prevention Education  Suicide Prevention Education:  Education Completed; Majed Colgrove, wife, [phone] has been identified by the patient as the family member/significant other with whom the patient will be residing, and identified as the person(s) who will aid the patient in the event of a mental health crisis (suicidal ideations/suicide attempt).  With written consent from the patient, the family member/significant other has been provided the following suicide prevention education, prior to the and/or following the discharge of the patient.  The suicide prevention education provided includes the following:  Suicide risk factors  Suicide prevention and interventions  National Suicide Hotline telephone number  [facility] assessment telephone number  [facility] Emergency Assistance 911  [facility] and/or Residential Mobile Crisis Unit telephone number  Request made of family/significant other to:  Remove weapons (e.g., guns, rifles, knives), all items previously/currently identified as safety concern.    Remove drugs/medications (over-the-counter, prescriptions, illicit drugs), all items previously/currently identified as a safety concern.  The family member/significant other verbalizes understanding of the suicide prevention education information provided.  The family member/significant other agrees to remove the items of safety concern listed above.  Harden Mo 04/09/2019, 1:49 PM

## 2019-04-09 NOTE — [facility] Suicide Risk Assessment (Signed)
[facility] INPATIENT:  Family/Significant Other Suicide Prevention Education  Suicide Prevention Education:  Contact Attempts: Tason Larmore, wife, [phone] has been identified by the patient as the family member/significant other with whom the patient will be residing, and identified as the person(s) who will aid the patient in the event of a mental health crisis.  With written consent from the patient, two attempts were made to provide suicide prevention education, prior to and/or following the patient's discharge.  We were unsuccessful in providing suicide prevention education.  A suicide education pamphlet was given to the patient to share with family/significant other.  Date and time of first attempt: 04/09/2019 10:04AM Date and time of second attempt: Second attempt is needed.   Harden Mo 04/09/2019, 10:03 AM

## 2019-04-09 NOTE — Plan of Care (Signed)
Patient newly admitted, hasn't had time to progress.   Problem: Education: Goal: Knowledge of [facility] Education information/materials will improve Outcome: Not Progressing Goal: Emotional status will improve Outcome: Not Progressing Goal: Mental status will improve Outcome: Not Progressing Goal: Verbalization of understanding the information provided will improve Outcome: Not Progressing   Problem: Safety: Goal: Periods of time without injury will increase Outcome: Not Progressing   Problem: Education: Goal: Ability to make informed decisions regarding treatment will improve Outcome: Not Progressing   Problem: Medication: Goal: Compliance with prescribed medication regimen will improve Outcome: Not Progressing   

## 2019-04-09 NOTE — [facility] Group Notes (Signed)
Balance In Life 04/09/2019 1PM  Type of Therapy/Topic:  Group Therapy:  Balance in Life  Participation Level:  Did Not Attend  Description of Group:   This group will address the concept of balance and how it feels and looks when one is unbalanced. Patients will be encouraged to process areas in their lives that are out of balance and identify reasons for remaining unbalanced. Facilitators will guide patients in utilizing problem-solving interventions to address and correct the stressor making their life unbalanced. Understanding and applying boundaries will be explored and addressed for obtaining and maintaining a balanced life. Patients will be encouraged to explore ways to assertively make their unbalanced needs known to significant others in their lives, using other group members and facilitator for support and feedback.  Therapeutic Goals: 1. Patient will identify two or more emotions or situations they have that consume much of in their lives. 2. Patient will identify signs/triggers that life has become out of balance:  3. Patient will identify two ways to set boundaries in order to achieve balance in their lives:  4. Patient will demonstrate ability to communicate their needs through discussion and/or role plays  Summary of Patient Progress:    Therapeutic Modalities:   Cognitive Behavioral Therapy Solution-Focused Therapy Assertiveness Training  Dekker Verga T Aalijah Mims, LCSW  

## 2019-04-09 NOTE — [facility] Suicide Risk Assessment (Signed)
[facility]   Nursing information obtained from:  Patient Demographic factors:  Male Current Mental Status:  NA Loss Factors:  NA Historical Factors:  NA Risk Reduction Factors:  Positive therapeutic relationship  Total Time spent with patient: 1 hour Principal Problem: <principal problem not specified> Diagnosis:  Active Problems:   Suicidal behavior  Subjective Data: Patient seen and chart reviewed.  This is a 48 year old man with what sounds like a past history of depression but who was brought to the hospital intoxicated and after telling police that he was intending to kill himself.  On interview today continues to endorse multiple symptoms of depression going on for months now.  Poor response if any to current treatment.  Ongoing alcohol and multiple drug abuse.  Suicidal thoughts without any plan in the hospital.  No hallucinations or description of recent psychosis no homicidal ideation  Continued Clinical Symptoms:  Alcohol Use Disorder Identification Test Final Score (AUDIT): 5 The "Alcohol Use Disorders Identification Test", Guidelines for Use in Primary Care, Second Edition.  World Science writerHealth Organization [facility](WHO). Score between 0-7:  no or low risk or alcohol related problems. Score between 8-15:  moderate risk of alcohol related problems. Score between 16-19:  high risk of alcohol related problems. Score 20 or above:  warrants further diagnostic evaluation for alcohol dependence and treatment.   CLINICAL FACTORS:   Depression:   Hopelessness Impulsivity Alcohol/Substance Abuse/Dependencies   Musculoskeletal: Strength & Muscle Tone: within normal limits Gait & Station: normal Patient leans: N/A  Psychiatric Specialty Exam: Physical Exam  Nursing note and vitals reviewed. Constitutional: He appears well-developed and well-nourished.  HENT:  Head: Normocephalic and atraumatic.  Eyes: Pupils are equal, round, and reactive to light. Conjunctivae  are normal.  Neck: Normal range of motion.  Cardiovascular: Regular rhythm and normal heart sounds.  Respiratory: Effort normal. No respiratory distress.  GI: Soft.  Musculoskeletal: Normal range of motion.  Neurological: He is alert.  Skin: Skin is warm and dry.  Psychiatric: His speech is normal and behavior is normal. His affect is blunt. Cognition and memory are impaired. He expresses impulsivity and inappropriate judgment. He exhibits a depressed mood. He expresses suicidal ideation. He expresses suicidal plans.    Review of Systems  Constitutional: Negative.   HENT: Negative.   Eyes: Negative.   Respiratory: Negative.   Cardiovascular: Negative.   Gastrointestinal: Negative.   Musculoskeletal: Negative.   Skin: Negative.   Neurological: Negative.   Psychiatric/Behavioral: Positive for depression, memory loss, substance abuse and suicidal ideas. Negative for hallucinations. The patient is nervous/anxious and has insomnia.     Blood pressure (!) 143/101, pulse 78, temperature 97.7 F (36.5 C), temperature source Oral, resp. rate 17, height 5\' 8"  (1.727 m), weight 83.9 kg, SpO2 99 %.Body mass index is 28.12 kg/m.  General Appearance: Disheveled  Eye Contact:  Minimal  Speech:  Slow  Volume:  Decreased  Mood:  Depressed  Affect:  Congruent  Thought Process:  Coherent  Orientation:  Full (Time, Place, and Person)  Thought Content:  Logical  Suicidal Thoughts:  Yes.  with intent/plan  Homicidal Thoughts:  No  Memory:  Immediate;   Fair Recent;   Fair Remote;   Fair  Judgement:  Impaired  Insight:  Fair  Psychomotor Activity:  Decreased  Concentration:  Concentration: Fair  Recall:  [employer]Fair  Fund of Knowledge:  Fair  Language:  Fair  Akathisia:  No  Handed:  Right  AIMS (if indicated):  Assets:  Desire for Improvement Housing Physical Health  ADL's:  Intact  Cognition:  WNL  Sleep:  Number of Hours: 7.75      COGNITIVE FEATURES THAT CONTRIBUTE TO RISK:   Closed-mindedness, Polarized thinking and Thought constriction (tunnel vision)    SUICIDE RISK:   Moderate:  Frequent suicidal ideation with limited intensity, and duration, some specificity in terms of plans, no associated intent, good self-control, limited dysphoria/symptomatology, some risk factors present, and identifiable protective factors, including available and accessible social support.  PLAN OF CARE: Patient seen chart reviewed.  Patient is cooperative and agreeable to treatment in the hospital.  No intention to harm himself here.  Open to the possibility of getting better.  Nevertheless multiple symptoms of major depression and active suicidal ideation with past suicide attempts endorsed as well.  Multiple risk factors.  Treatment will be applied for depression and substance abuse and we will work on reevaluating suicidality and coming up with an appropriate discharge plan.  I certify that inpatient services furnished can reasonably be expected to improve the patient's condition.   Mordecai Rasmussen, MD 04/09/2019, 12:04 PM

## 2019-04-10 MED ORDER — MIRTAZAPINE 15 MG PO TABS
30.0000 mg | ORAL_TABLET | Freq: Every day | ORAL | Status: DC
  Administered 2019-04-10 – 2019-04-15 (×6): 30 mg via ORAL
  Filled 2019-04-10 (×6): qty 2

## 2019-04-10 NOTE — [facility] Counselor (Signed)
CSW spoke with [facility] to check the status of the referral.  CSW was informed that it has not been reviewed at this time and will be reviewed later today.  Penni Homans, MSW, LCSW 04/10/2019 1:15 PM

## 2019-04-10 NOTE — Plan of Care (Signed)
D- Patient alert and oriented. Patient presents in a depressed, but pleasant mood on assessment reporting that he slept "about the same" last night and had no major complaints to voice to this Clinical research associate. Patient reported that he still has depression and he received a "pill last night" that helped with his anxiety. Patient continues to isolate to his room except for meals and has not participated in any unit activities. Patient denies SI, HI, AVH, and pain at this time. Patient had no stated goals for today.  A- Scheduled medications administered to patient, per MD orders. Support and encouragement provided.  Routine safety checks conducted every 15 minutes.  Patient informed to notify staff with problems or concerns.  R- No adverse drug reactions noted. Patient contracts for safety at this time. Patient compliant with medications and treatment plan. Patient receptive, calm, and cooperative. Patient interacts well with others on the unit.  Patient remains safe at this time.  Problem: Education: Goal: Knowledge of [place] [facility] Education information/materials will improve Outcome: Progressing Goal: Emotional status will improve Outcome: Progressing Goal: Mental status will improve Outcome: Progressing Goal: Verbalization of understanding the information provided will improve Outcome: Progressing   Problem: Safety: Goal: Periods of time without injury will increase Outcome: Progressing   Problem: Education: Goal: Ability to make informed decisions regarding treatment will improve Outcome: Progressing   Problem: Medication: Goal: Compliance with prescribed medication regimen will improve Outcome: Progressing

## 2019-04-10 NOTE — Plan of Care (Signed)
Patient was isolative to his room this evening and didn't interact with peers or staff other than for medications and assessment with this Clinical research associate.   Problem: Education: Goal: Emotional status will improve Outcome: Not Progressing Goal: Mental status will improve Outcome: Not Progressing

## 2019-04-10 NOTE — Progress Notes (Signed)
Recreation Therapy Notes  Date:04/10/2019  Time:9:30 am  Location:Craft room  Behavioral response:N/A  Intervention Topic: Team work  Discussion/Intervention: Patient did not attend group.  Clinical Observations/Feedback:  Patient did not attend group.  Orrie Lascano LRT/CTRS         Zakariyah Freimark 04/10/2019 10:32 AM 

## 2019-04-10 NOTE — Tx Team (Addendum)
Interdisciplinary Treatment and Diagnostic Plan Update  04/10/2019 Time of Session: 2:30pm Brad SpeckingWilliam K Chang MRN: [id number]  Principal Diagnosis: Severe major depression, single episode, without psychotic features (HCC)  Secondary Diagnoses: Principal Problem:   Severe major depression, single episode, without psychotic features (HCC) Active Problems:   Blindness of left eye   Benign essential hypertension   Cocaine abuse (HCC)   Suicidal behavior   Alcohol abuse   Current Medications:  Current Facility-Administered Medications  Medication Dose Route Frequency Provider Last Rate Last Dose  . acetaminophen (TYLENOL) tablet 650 mg  650 mg Oral Q6H PRN Mariel CraftMaurer, Sheila M, MD      . alum & mag hydroxide-simeth (MAALOX/MYLANTA) 200-200-20 MG/5ML suspension 30 mL  30 mL Oral Q4H PRN Mariel CraftMaurer, Sheila M, MD      . diphenhydrAMINE (BENADRYL) capsule 50 mg  50 mg Oral Q6H PRN Mariel CraftMaurer, Sheila M, MD      . losartan (COZAAR) tablet 50 mg  50 mg Oral Daily Mariel CraftMaurer, Sheila M, MD   50 mg at 04/10/19 [id number]   And  . hydrochlorothiazide (MICROZIDE) capsule 12.5 mg  12.5 mg Oral Daily Mariel CraftMaurer, Sheila M, MD   12.5 mg at 04/10/19 0644  . hydrOXYzine (ATARAX/VISTARIL) tablet 25 mg  25 mg Oral TID PRN Mariel CraftMaurer, Sheila M, MD   25 mg at 04/09/19 2125  . magnesium hydroxide (MILK OF MAGNESIA) suspension 30 mL  30 mL Oral Daily PRN Mariel CraftMaurer, Sheila M, MD      . mirtazapine (REMERON) tablet 15 mg  15 mg Oral QHS Clapacs, John T, MD   15 mg at 04/09/19 2125  . naproxen (NAPROSYN) tablet 500 mg  500 mg Oral BID PRN Mariel CraftMaurer, Sheila M, MD      . nicotine (NICODERM CQ - dosed in mg/24 hr) patch 7 mg  7 mg Transdermal Daily Clapacs, Jackquline DenmarkJohn T, MD   7 mg at 04/10/19 0919  . OLANZapine zydis (ZYPREXA) disintegrating tablet 10 mg  10 mg Oral Q8H PRN Mariel CraftMaurer, Sheila M, MD       And  . ziprasidone (GEODON) injection 20 mg  20 mg Intramuscular PRN Mariel CraftMaurer, Sheila M, MD       PTA Medications: Medications Prior to Admission  Medication  Sig Dispense Refill Last Dose  . cyclobenzaprine (FLEXERIL) 10 MG tablet Take 1/2 to 1 whole tablet by mouth every 8 hours as needed for muscle pain/spasms. (Patient not taking: Reported on 04/08/2019) 15 tablet 0 Completed Course at Unknown time  . naproxen (NAPROSYN) 500 MG tablet Take 1 tablet (500 mg total) by mouth 2 (two) times daily as needed for moderate pain or headache. (Patient not taking: Reported on 04/08/2019) 20 tablet 0 Completed Course at Unknown time    Patient Stressors: Medication change or noncompliance Substance abuse  Patient Strengths: Motivation for treatment/growth Supportive family/friends  Treatment Modalities: Medication Management, Group therapy, Case management,  1 to 1 session with clinician, Psychoeducation, Recreational therapy.   Physician Treatment Plan for Primary Diagnosis: Severe major depression, single episode, without psychotic features (HCC) Long Term Goal(s): Improvement in symptoms so as ready for discharge Improvement in symptoms so as ready for discharge   Short Term Goals: Ability to disclose and discuss suicidal ideas Ability to demonstrate self-control will improve Ability to identify and develop effective coping behaviors will improve Ability to identify triggers associated with substance abuse/mental health issues will improve  Medication Management: Evaluate patient's response, side effects, and tolerance of medication regimen.  Therapeutic Interventions: 1 to 1 sessions,  Unit Group sessions and Medication administration.  Evaluation of Outcomes: Progressing  Physician Treatment Plan for Secondary Diagnosis: Principal Problem:   Severe major depression, single episode, without psychotic features (HCC) Active Problems:   Blindness of left eye   Benign essential hypertension   Cocaine abuse (HCC)   Suicidal behavior   Alcohol abuse  Long Term Goal(s): Improvement in symptoms so as ready for discharge Improvement in symptoms so  as ready for discharge   Short Term Goals: Ability to disclose and discuss suicidal ideas Ability to demonstrate self-control will improve Ability to identify and develop effective coping behaviors will improve Ability to identify triggers associated with substance abuse/mental health issues will improve     Medication Management: Evaluate patient's response, side effects, and tolerance of medication regimen.  Therapeutic Interventions: 1 to 1 sessions, Unit Group sessions and Medication administration.  Evaluation of Outcomes: Progressing   RN Treatment Plan for Primary Diagnosis: Severe major depression, single episode, without psychotic features (HCC) Long Term Goal(s): Knowledge of disease and therapeutic regimen to maintain health will improve  Short Term Goals: Ability to verbalize frustration and anger appropriately will improve, Ability to demonstrate self-control, Ability to participate in decision making will improve, Ability to verbalize feelings will improve, Ability to disclose and discuss suicidal ideas and Ability to identify and develop effective coping behaviors will improve  Medication Management: RN will administer medications as ordered by provider, will assess and evaluate patient's response and provide education to patient for prescribed medication. RN will report any adverse and/or side effects to prescribing provider.  Therapeutic Interventions: 1 on 1 counseling sessions, Psychoeducation, Medication administration, Evaluate responses to treatment, Monitor vital signs and CBGs as ordered, Perform/monitor CIWA, COWS, AIMS and Fall Risk screenings as ordered, Perform wound care treatments as ordered.  Evaluation of Outcomes: Progressing   LCSW Treatment Plan for Primary Diagnosis: Severe major depression, single episode, without psychotic features (HCC) Long Term Goal(s): Safe transition to appropriate next level of care at discharge, Engage patient in therapeutic  group addressing interpersonal concerns.  Short Term Goals: Engage patient in aftercare planning with referrals and resources, Increase social support, Increase ability to appropriately verbalize feelings, Increase emotional regulation, Facilitate acceptance of mental health diagnosis and concerns and Identify triggers associated with mental health/substance abuse issues  Therapeutic Interventions: Assess for all discharge needs, 1 to 1 time with Social worker, Explore available resources and support systems, Assess for adequacy in community support network, Educate family and significant other(s) on suicide prevention, Complete Psychosocial Assessment, Interpersonal group therapy.  Evaluation of Outcomes: Progressing   Progress in Treatment: Attending groups: No. Participating in groups: No. Taking medication as prescribed: Yes. Toleration medication: Yes. Family/Significant other contact made: Yes, individual(s) contacted:  SPE completed with pt's wife Patient understands diagnosis: Yes. Discussing patient identified problems/goals with staff: Yes. Medical problems stabilized or resolved: Yes. Denies suicidal/homicidal ideation: Yes. Issues/concerns per patient self-inventory: No. Other: none  New problem(s) identified: No, Describe:  none  New Short Term/Long Term Goal(s): detox, medication management for mood stabilization; elimination of SI thoughts; development of comprehensive mental wellness/sobriety plan.  Patient Goals:  "get these weird feelings out of my head I guess"  Discharge Plan or Barriers: Pt reports that he wants residential treatment.  Referral has been made for [facility], as of 04/10/2019, [facility] is still review the referral.   Reason for Continuation of Hospitalization: Aggression Anxiety Depression Medication stabilization Suicidal ideation Withdrawal symptoms  Estimated Length of Stay: 1-5 days  Recreational Therapy: Patient Stressors:  Relationship Patient  Goal: Patient will engage in interactions with peers and staff in pro-social manner at least 2x within 5 recreation therapy group sessions  Attendees: Patient: Brad Chang 04/10/2019 3:05 PM  Physician: Dr. Toni Amend, MD 04/10/2019 3:05 PM  Nursing: Doyce Para, RN 04/10/2019 3:05 PM  RN Care Manager: 04/10/2019 3:05 PM  Social Worker: Penni Homans, MSW, LCSW 04/10/2019 3:05 PM  Recreational Therapist: Garret Reddish, Drue Flirt, LRT 04/10/2019 3:05 PM  Other: Lowella Dandy, LCSW 04/10/2019 3:05 PM  Other: Iris Pert, LCSW 04/10/2019 3:05 PM  Other: 04/10/2019 3:05 PM      Scribe for Treatment Team: Harden Mo, LCSW 04/10/2019 3:13 PM

## 2019-04-10 NOTE — Progress Notes (Signed)
Recreation Therapy Notes  INPATIENT RECREATION THERAPY ASSESSMENT  Patient Details Name: Brad Chang MRN: [id number] DOB: [birth year] Today's Date: 04/10/2019       Information Obtained From: Patient  Able to Participate in Assessment/Interview: Yes  Patient Presentation: Responsive  Reason for Admission (Per Patient): Active Symptoms  Patient Stressors: Relationship  Coping Skills:   Substance Abuse  Leisure Interests (2+):  (Working Holiday representative)  Frequency of Recreation/Participation: Pharmacist, community Resources:     [employer]:     Current Use:    If no, Barriers?:    Expressed Interest in [employer] Information:    [employer] of Residence:  Film/video editor  Patient Main Form of Transportation: Set designer  Patient Strengths:  I work  Patient Identified Areas of Improvement:  N/A  Patient Goal for Hospitalization:  Get this weird feeling out of my head  Current SI (including self-harm):  No  Current HI:  No  Current AVH: No  Staff Intervention Plan: Group Attendance, Collaborate with Interdisciplinary Treatment Team  Consent to Intern Participation: N/A  Kirstan Fentress 04/10/2019, 3:15 PM

## 2019-04-10 NOTE — Progress Notes (Addendum)
D - Patient was pleasant during assessment and medication administration. Patient denies SI/HI/AVH, pain, and depression with this Clinical research associate. Patient stated his anxiety was 4/10, see MAR. Patient was isolative to his room and only interacted with staff when this writer assessed him and gave him medications.   A - Patient was compliant with medication administration per MD orders and procedures on the unit. Patient was given education. Patient was given support and encouragement to be active in his treatment plan. Patient informed to let staff know if there are any issues or problems on the unit.   R - Patient being monitored Q 15 minutes for safety per unit protocol. Patient remains safe on the unit.  Patient's BP elevated this morning, medications administered per MD orders.

## 2019-04-10 NOTE — [facility] Group Notes (Signed)
LCSW Group Therapy Note  04/10/2019 12:23 PM  Type of Therapy and Topic:  Group Therapy:  Feelings around Relapse and Recovery  Participation Level:  Did Not Attend   Description of Group:    Patients in this group will discuss emotions they experience before and after a relapse. They will process how experiencing these feelings, or avoidance of experiencing them, relates to having a relapse. Facilitator will guide patients to explore emotions they have related to recovery. Patients will be encouraged to process which emotions are more powerful. They will be guided to discuss the emotional reaction significant others in their lives may have to their relapse or recovery. Patients will be assisted in exploring ways to respond to the emotions of others without this contributing to a relapse.  Therapeutic Goals: 1. Patient will identify two or more emotions that lead to a relapse for them 2. Patient will identify two emotions that result when they relapse 3. Patient will identify two emotions related to recovery 4. Patient will demonstrate ability to communicate their needs through discussion and/or role plays   Summary of Patient Progress: x    Therapeutic Modalities:   Cognitive Behavioral Therapy Solution-Focused Therapy Assertiveness Training Relapse Prevention Therapy   Iris Pert, MSW, LCSW Clinical Social Work 04/10/2019 12:23 PM

## 2019-04-10 NOTE — Progress Notes (Signed)
[facility] MD Progress Note  04/10/2019 3:33 PM Brad Chang  MRN:  [id number] Subjective: Patient seen and chart reviewed.  Patient has been in bed most of the day.  He says his mood is still feeling depressed and he feels exhausted and tired all the time.  Still has passive suicidal thoughts but without any intention or plan.  No psychotic symptoms.  Feeling run down a little bit.  Looks like he could have a cold.  Blood pressure slightly elevated but not extremely so and he is taking his medicine. Principal Problem: Severe major depression, single episode, without psychotic features ([place]) Diagnosis: Principal Problem:   Severe major depression, single episode, without psychotic features ([place]) Active Problems:   Blindness of left eye   Benign essential hypertension   Cocaine abuse ([place])   Suicidal behavior   Alcohol abuse  Total Time spent with patient: 30 minutes  Past Psychiatric History: Patient has a history of substance abuse and depression.  Recent suicidal ideation.  Past suicide attempt.  Past Medical History:  Past Medical History:  Diagnosis Date  . Hypertension     Past Surgical History:  Procedure Laterality Date  . EYE SURGERY     Family History:  Family History  Problem Relation Age of Onset  . Hypertension Mother   . Cancer Father    Family Psychiatric  History: Depression Social History:  Social History   Substance and Sexual Activity  Alcohol Use Yes     Social History   Substance and Sexual Activity  Drug Use Never    Social History   Socioeconomic History  . Marital status: Single    Spouse name: Not on file  . Number of children: Not on file  . Years of education: Not on file  . Highest education level: Not on file  Occupational History  . Not on file  Social Needs  . Financial resource strain: Not on file  . Food insecurity:    Worry: Not on file    Inability: Not on file  . Transportation needs:    Medical: Not on file   Non-medical: Not on file  Tobacco Use  . Smoking status: Never Smoker  . Smokeless tobacco: Never Used  Substance and Sexual Activity  . Alcohol use: Yes  . Drug use: Never  . Sexual activity: Not on file  Lifestyle  . Physical activity:    Days per week: Not on file    Minutes per session: Not on file  . Stress: Not on file  Relationships  . Social connections:    Talks on phone: Not on file    Gets together: Not on file    Attends religious service: Not on file    Active member of club or organization: Not on file    Attends meetings of clubs or organizations: Not on file    Relationship status: Not on file  Other Topics Concern  . Not on file  Social History Narrative  . Not on file   Additional Social History:                         Sleep: Fair  Appetite:  Fair  Current Medications: Current Facility-Administered Medications  Medication Dose Route Frequency Provider Last Rate Last Dose  . acetaminophen (TYLENOL) tablet 650 mg  650 mg Oral Q6H PRN Lavella Hammock, MD      . alum & mag hydroxide-simeth (MAALOX/MYLANTA) 200-200-20 MG/5ML suspension 30 mL  30 mL Oral Q4H PRN Lavella Hammock, MD      . diphenhydrAMINE (BENADRYL) capsule 50 mg  50 mg Oral Q6H PRN Lavella Hammock, MD      . losartan (COZAAR) tablet 50 mg  50 mg Oral Daily Lavella Hammock, MD   50 mg at 04/10/19 0626   And  . hydrochlorothiazide (MICROZIDE) capsule 12.5 mg  12.5 mg Oral Daily Lavella Hammock, MD   12.5 mg at 04/10/19 0644  . hydrOXYzine (ATARAX/VISTARIL) tablet 25 mg  25 mg Oral TID PRN Lavella Hammock, MD   25 mg at 04/09/19 2125  . magnesium hydroxide (MILK OF MAGNESIA) suspension 30 mL  30 mL Oral Daily PRN Lavella Hammock, MD      . mirtazapine (REMERON) tablet 30 mg  30 mg Oral QHS Jamilia Jacques T, MD      . naproxen (NAPROSYN) tablet 500 mg  500 mg Oral BID PRN Lavella Hammock, MD      . nicotine (NICODERM CQ - dosed in mg/24 hr) patch 7 mg  7 mg Transdermal Daily  Zahir Eisenhour, Madie Reno, MD   7 mg at 04/10/19 0919  . OLANZapine zydis (ZYPREXA) disintegrating tablet 10 mg  10 mg Oral Q8H PRN Lavella Hammock, MD       And  . ziprasidone (GEODON) injection 20 mg  20 mg Intramuscular PRN Lavella Hammock, MD        Lab Results:  Results for orders placed or performed during the hospital encounter of 04/08/19 (from the past 48 hour(s))  SARS Coronavirus 2 (CEPHEID - Performed in [facility] hospital lab), Hosp Order     Status: None   Collection Time: 04/08/19  5:00 PM  Result Value Ref Range   SARS Coronavirus 2 NEGATIVE NEGATIVE    Comment: (NOTE) If result is NEGATIVE SARS-CoV-2 target nucleic acids are NOT DETECTED. The SARS-CoV-2 RNA is generally detectable in upper and lower  respiratory specimens during the acute phase of infection. The lowest  concentration of SARS-CoV-2 viral copies this assay can detect is 250  copies / mL. A negative result does not preclude SARS-CoV-2 infection  and should not be used as the sole basis for treatment or other  patient management decisions.  A negative result may occur with  improper specimen collection / handling, submission of specimen other  than nasopharyngeal swab, presence of viral mutation(s) within the  areas targeted by this assay, and inadequate number of viral copies  (<250 copies / mL). A negative result must be combined with clinical  observations, patient history, and epidemiological information. If result is POSITIVE SARS-CoV-2 target nucleic acids are DETECTED. The SARS-CoV-2 RNA is generally detectable in upper and lower  respiratory specimens dur ing the acute phase of infection.  Positive  results are indicative of active infection with SARS-CoV-2.  Clinical  correlation with patient history and other diagnostic information is  necessary to determine patient infection status.  Positive results do  not rule out bacterial infection or co-infection with other viruses. If result is PRESUMPTIVE  POSTIVE SARS-CoV-2 nucleic acids MAY BE PRESENT.   A presumptive positive result was obtained on the submitted specimen  and confirmed on repeat testing.  While 2019 novel coronavirus  (SARS-CoV-2) nucleic acids may be present in the submitted sample  additional confirmatory testing may be necessary for epidemiological  and / or clinical management purposes  to differentiate between  SARS-CoV-2 and other Sarbecovirus currently known to infect humans.  If clinically indicated additional testing with an alternate test  methodology [phone]) is advised. The SARS-CoV-2 RNA is generally  detectable in upper and lower respiratory sp ecimens during the acute  phase of infection. The expected result is Negative. Fact Sheet for Patients:  StrictlyIdeas.no Fact Sheet for Healthcare Providers: BankingDealers.co.za This test is not yet approved or cleared by the [place] FDA and has been authorized for detection and/or diagnosis of SARS-CoV-2 by FDA under an Emergency Use Authorization (EUA).  This EUA will remain in effect (meaning this test can be used) for the duration of the COVID-19 declaration under Section 564(b)(1) of the Act, 21 U.S.C. section 360bbb-3(b)(1), unless the authorization is terminated or revoked sooner. Performed at [facility], [address] [place], [place] [address]     Blood Alcohol level:  Lab Results  Component Value Date   ETH 123 (H) 03/55/9741    Metabolic Disorder Labs: No results found for: HGBA1C, MPG No results found for: PROLACTIN No results found for: CHOL, TRIG, HDL, CHOLHDL, VLDL, LDLCALC  Physical Findings: AIMS:  , ,  ,  ,    CIWA:  CIWA-Ar Total: 0 COWS:     Musculoskeletal: Strength & Muscle Tone: within normal limits Gait & Station: normal Patient leans: N/A  Psychiatric Specialty Exam: Physical Exam  Nursing note and vitals reviewed. Constitutional: He appears  well-developed and well-nourished.  HENT:  Head: Normocephalic and atraumatic.  Eyes: Pupils are equal, round, and reactive to light. Conjunctivae are normal.  Neck: Normal range of motion.  Cardiovascular: Regular rhythm and normal heart sounds.  Respiratory: Effort normal.  GI: Soft.  Musculoskeletal: Normal range of motion.  Neurological: He is alert.  Skin: Skin is warm and dry.  Psychiatric: Judgment normal. His speech is delayed and tangential. He is slowed and withdrawn. Cognition and memory are impaired. He exhibits a depressed mood. He expresses suicidal ideation. He expresses no suicidal plans.    Review of Systems  Constitutional: Negative.   HENT: Negative.   Eyes: Negative.   Respiratory: Negative.   Cardiovascular: Negative.   Gastrointestinal: Negative.   Musculoskeletal: Negative.   Skin: Negative.   Neurological: Negative.   Psychiatric/Behavioral: Positive for depression, substance abuse and suicidal ideas. Negative for hallucinations and memory loss. The patient is not nervous/anxious and does not have insomnia.     Blood pressure (!) 123/98, pulse (!) 119, temperature 97.7 F (36.5 C), temperature source Oral, resp. rate 17, height 5' 8"  (1.727 m), weight 83.9 kg, SpO2 98 %.Body mass index is 28.12 kg/m.  General Appearance: Disheveled  Eye Contact:  Fair  Speech:  Slow  Volume:  Decreased  Mood:  Depressed  Affect:  Congruent  Thought Process:  Coherent  Orientation:  Full (Time, Place, and Person)  Thought Content:  Logical  Suicidal Thoughts:  Yes.  without intent/plan  Homicidal Thoughts:  No  Memory:  Immediate;   Fair Recent;   Fair Remote;   Fair  Judgement:  Fair  Insight:  Shallow  Psychomotor Activity:  Decreased  Concentration:  Concentration: Fair  Recall:  [employer] of Knowledge:  Fair  Language:  Fair  Akathisia:  No  Handed:  Right  AIMS (if indicated):     Assets:  Desire for Improvement  ADL's:  Impaired  Cognition:   Impaired,  Mild  Sleep:  Number of Hours: 7.15     Treatment Plan Summary: Daily contact with patient to assess and evaluate symptoms and progress in treatment, Medication  management and Plan Patient met with treatment team.  Reassure him that we will continue working on improvement of mood.  Increased dose of mirtazapine to 30 mg at night for depression.  Continue current medicine for blood pressure.  Encourage patient to get out of his room and be more interactive on the unit.  Alethia Berthold, MD 04/10/2019, 3:33 PM

## 2019-04-11 DIAGNOSIS — F1010 Alcohol abuse, uncomplicated: Secondary | ICD-10-CM

## 2019-04-11 DIAGNOSIS — F322 Major depressive disorder, single episode, severe without psychotic features: Principal | ICD-10-CM

## 2019-04-11 DIAGNOSIS — F1410 Cocaine abuse, uncomplicated: Secondary | ICD-10-CM

## 2019-04-11 NOTE — Progress Notes (Signed)
D: Patient stated slept good last night .Stated appetite good and energy level   normal. Stated concentration is good . Stated on Depression scale 2 , hopeless 2 and anxiety 4 .( low 0-10 high) Denies suicidal  homicidal ideations  .  No auditory hallucinations  No pain concerns . Appropriate ADL'S. Interacting with peers and staff.  Patient  aware of [employer] , unit programing , able to verbalize understanding of information received. Mental and emotional status improved. Limiting  Engaging in activities on the unit . Voice no concern around wake or sleep cycle  Voice of no safety concerns . Continue to work on coping  and Tourist information centre manager . No withdrawal symptoms  Noted from ETOH   A: Encourage patient participation with unit programming . Instruction  Given on  Medication , verbalize understanding.  R: Voice no other concerns. Staff continue to monitor

## 2019-04-11 NOTE — [facility] Group Notes (Signed)
LCSW Group Therapy Note  04/11/2019 1:15pm  Type of Therapy and Topic:  Group Therapy:  Cognitive Distortions  Participation Level:  Active   Description of Group:    Patients in this group will be introduced to the topic of cognitive distortions.  Patients will identify and describe cognitive distortions, describe the feelings these distortions create for them.  Patients will identify one or more situations in their personal life where they have cognitively distorted thinking and will verbalize challenging this cognitive distortion through positive thinking skills.  Patients will practice the skill of using positive affirmations to challenge cognitive distortions using affirmation cards.    Therapeutic Goals:  1. Patient will identify two or more cognitive distortions they have used 2. Patient will identify one or more emotions that stem from use of a cognitive distortion 3. Patient will demonstrate use of a positive affirmation to counter a cognitive distortion through discussion and/or role play. 4. Patient will describe one way cognitive distortions can be detrimental to wellness   Summary of Patient Progress: The patient reported that he  feels "good, a lot going on." Patients were introduced to the topic of cognitive distortions. The patient was able to identify and describe cognitive distortions, described the feelings these distortions create for him.  The patient shared he uses "magnification" in many situations. Patient identified a situation in his personal life where he has cognitively distorted thinking and was able to verbalize and challenged this cognitive distortion through positive thinking skills. Patient was able to provide support and validation to other group members.    Therapeutic Modalities:   Cognitive Behavioral Therapy Motivational Interviewing   Ligaya Cormier  CUEBAS-COLON, LCSW 04/11/2019 10:17 AM

## 2019-04-11 NOTE — Progress Notes (Signed)
Patient is engaging appropriate and responding well to assessments, patient  expressed that he is doing well and his medications are working good, patient is well rested and appear stable and oriented, taking care of ADLs , denies any si,hi, avh. Endorsing good positive energy, eating and well hydrated, patient still thinks that he is depressed but don't know why, support and education is provided to cultivate a coping strategy to improve his mental and physical state, patient acknowledge information provided , no distress and 15 minutes safety rounds is in progress for safety.

## 2019-04-11 NOTE — Plan of Care (Signed)
Patient  aware of [place] [employer] , unit programing , able to verbalize understanding of information received. Mental and emotional status improved. Engaging in activities on the unit .  Marland Kitchen  Voice of no safety concerns . 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 Problem: Education: Goal: Knowledge of [place] [facility] Education information/materials will improve Outcome: Progressing Goal: Emotional status will improve Outcome: Progressing Goal: Mental status will improve Outcome: Progressing Goal: Verbalization of understanding the information provided will improve Outcome: Progressing   Problem: Safety: Goal: Periods of time without injury will increase Outcome: Progressing   Problem: Education: Goal: Ability to make informed decisions regarding treatment will improve Outcome: Progressing   Problem: Medication: Goal: Compliance with prescribed medication regimen will improve Outcome: Progressing

## 2019-04-11 NOTE — Progress Notes (Signed)
[facility]  04/11/2019 1:08 PM Brad Chang  MRN:  [id number] Subjective: Patient seen and chart reviewed.    Pt is still in bed.  Brad Chang did have breakfast and lunch, and agreed to get out of his room a bit more and go to groups.   Otherwise, no complaints and tolerates meds.   Denied SI or Hi.   Principal Problem: Severe major depression, single episode, without psychotic features (HCC) Diagnosis: Principal Problem:   Severe major depression, single episode, without psychotic features (HCC) Active Problems:   Blindness of left eye   Benign essential hypertension   Cocaine abuse (HCC)   Suicidal behavior   Alcohol abuse  Total Time spent with patient: 15 minutes  Past Psychiatric History: Patient has a history of substance abuse and depression.  Recent suicidal ideation.  Past suicide attempt.  Past Medical History:  Past Medical History:  Diagnosis Date  . Hypertension     Past Surgical History:  Procedure Laterality Date  . EYE SURGERY     Family History:  Family History  Problem Relation Age of Onset  . Hypertension Mother   . Cancer Father    Family Psychiatric  History: Depression Social History:  Social History   Substance and Sexual Activity  Alcohol Use Yes     Social History   Substance and Sexual Activity  Drug Use Never    Social History   Socioeconomic History  . Marital status: Single    Spouse name: Not on file  . Number of children: Not on file  . Years of education: Not on file  . Highest education level: Not on file  Occupational History  . Not on file  Social Needs  . Financial resource strain: Not on file  . Food insecurity:    Worry: Not on file    Inability: Not on file  . Transportation needs:    Medical: Not on file    Non-medical: Not on file  Tobacco Use  . Smoking status: Never Smoker  . Smokeless tobacco: Never Used  Substance and Sexual Activity  . Alcohol use: Yes  . Drug use: Never  . Sexual  activity: Not on file  Lifestyle  . Physical activity:    Days per week: Not on file    Minutes per session: Not on file  . Stress: Not on file  Relationships  . Social connections:    Talks on phone: Not on file    Gets together: Not on file    Attends religious service: Not on file    Active member of club or organization: Not on file    Attends meetings of clubs or organizations: Not on file    Relationship status: Not on file  Other Topics Concern  . Not on file  Social History Narrative  . Not on file   Additional Social History:   Sleep: Fair  Appetite:  Fair  Current Medications: Current Facility-Administered Medications  Medication Dose Route Frequency Provider Last Rate Last Dose  . acetaminophen (TYLENOL) tablet 650 mg  650 mg Oral Q6H PRN Mariel CraftMaurer, Sheila M, MD      . alum & mag hydroxide-simeth (MAALOX/MYLANTA) 200-200-20 MG/5ML suspension 30 mL  30 mL Oral Q4H PRN Mariel CraftMaurer, Sheila M, MD      . diphenhydrAMINE (BENADRYL) capsule 50 mg  50 mg Oral Q6H PRN Mariel CraftMaurer, Sheila M, MD      . losartan (COZAAR) tablet 50 mg  50 mg Oral Daily Viviano SimasMaurer,  Orlean Bradford, MD   50 mg at 04/11/19 9381   And  . hydrochlorothiazide (MICROZIDE) capsule 12.5 mg  12.5 mg Oral Daily Mariel Craft, MD   12.5 mg at 04/11/19 8299  . hydrOXYzine (ATARAX/VISTARIL) tablet 25 mg  25 mg Oral TID PRN Mariel Craft, MD   25 mg at 04/10/19 2112  . magnesium hydroxide (MILK OF MAGNESIA) suspension 30 mL  30 mL Oral Daily PRN Mariel Craft, MD      . mirtazapine (REMERON) tablet 30 mg  30 mg Oral QHS Clapacs, John T, MD   30 mg at 04/10/19 2112  . naproxen (NAPROSYN) tablet 500 mg  500 mg Oral BID PRN Mariel Craft, MD      . nicotine (NICODERM CQ - dosed in mg/24 hr) patch 7 mg  7 mg Transdermal Daily Clapacs, Jackquline Denmark, MD   7 mg at 04/11/19 0831  . OLANZapine zydis (ZYPREXA) disintegrating tablet 10 mg  10 mg Oral Q8H PRN Mariel Craft, MD       And  . ziprasidone (GEODON) injection 20 mg  20 mg  Intramuscular PRN Mariel Craft, MD        Lab Results:  No results found for this or any previous visit (from the past 48 hour(s)).  Blood Alcohol level:  Lab Results  Component Value Date   ETH 123 (H) 04/08/2019    Metabolic Disorder Labs: No results found for: HGBA1C, MPG No results found for: PROLACTIN No results found for: CHOL, TRIG, HDL, CHOLHDL, VLDL, LDLCALC  Physical Findings: AIMS:  , ,  ,  ,    CIWA:  CIWA-Ar Total: 0 COWS:     Musculoskeletal: Strength & Muscle Tone: within normal limits Gait & Station: normal Patient leans: N/A  Psychiatric Specialty Exam: Physical Exam  Nursing note and vitals reviewed. Constitutional: Brad Chang appears well-developed and well-nourished.  HENT:  Head: Normocephalic and atraumatic.  Eyes: Pupils are equal, round, and reactive to light. Conjunctivae are normal.  Neck: Normal range of motion.  Cardiovascular: Regular rhythm and normal heart sounds.  Respiratory: Effort normal.  GI: Soft.  Musculoskeletal: Normal range of motion.  Neurological: Brad Chang is alert.  Skin: Skin is warm and dry.  Psychiatric: Judgment normal. His speech is delayed and tangential. Brad Chang is slowed and withdrawn. Cognition and memory are impaired. Brad Chang exhibits a depressed mood. Brad Chang expresses suicidal ideation. Brad Chang expresses no suicidal plans.    Review of Systems  Constitutional: Negative.   HENT: Negative.   Eyes: Negative.   Respiratory: Negative.   Cardiovascular: Negative.   Gastrointestinal: Negative.   Musculoskeletal: Negative.   Skin: Negative.   Neurological: Negative.   Psychiatric/Behavioral: Positive for depression, substance abuse and suicidal ideas. Negative for hallucinations and memory loss. The patient is not nervous/anxious and does not have insomnia.     Blood pressure (!) 112/91, pulse (!) 103, temperature (!) 97.1 F (36.2 C), temperature source Oral, resp. rate 16, height 5\' 8"  (1.727 m), weight 83.9 kg, SpO2 100 %.Body mass index is  28.12 kg/m.  General Appearance: Disheveled  Eye Contact:  Fair  Speech:  Slow  Volume:  Decreased  Mood:  Depressed  Affect:  Congruent  Thought Process:  Coherent  Orientation:  Full (Time, Place, and Person)  Thought Content:  Logical  Suicidal Thoughts:  Yes.  without intent/plan  Homicidal Thoughts:  No  Memory:  Immediate;   Fair Recent;   Fair Remote;   Fair  Judgement:  Fair  Insight:  Shallow  Psychomotor Activity:  Decreased  Concentration:  Concentration: Fair  Recall:  [employer] of Knowledge:  Fair  Language:  Fair  Akathisia:  No  Handed:  Right  AIMS (if indicated):     Assets:  Desire for Improvement  ADL's:  Impaired  Cognition:  Impaired,  Mild  Sleep:  Number of Hours: 7.75     Treatment Plan Summary: Daily contact with patient to assess and evaluate symptoms and progress in treatment and Medication management   # MDD, r/o SIMD -- continue Mirtazapine  qhs,  -- recommend therapy.   # polysubstance abuse (amphetmaine, cocaine, benzo, and alcohol).  -- recommend substance abuse counseling.  -- detox completed.   # Disposition: -- defer to primary team.    Annaliese Saez, MD 04/11/2019, 1:08 PM

## 2019-04-11 NOTE — Plan of Care (Signed)
  Problem: Education: Goal: Knowledge of [place] [facility] Education information/materials will improve Outcome: Progressing Goal: Emotional status will improve Outcome: Progressing Goal: Mental status will improve Outcome: Progressing Goal: Verbalization of understanding the information provided will improve Outcome: Progressing   Problem: Safety: Goal: Periods of time without injury will increase Outcome: Progressing   Problem: Education: Goal: Ability to make informed decisions regarding treatment will improve Outcome: Progressing   Problem: Medication: Goal: Compliance with prescribed medication regimen will improve Outcome: Progressing   

## 2019-04-11 NOTE — Plan of Care (Signed)
Patient was isolative to his room this evening. Patient is minimal with staff and peers.   Problem: Education: Goal: Emotional status will improve Outcome: Not Progressing Goal: Mental status will improve Outcome: Not Progressing

## 2019-04-11 NOTE — Progress Notes (Signed)
D - Patient was pleasant during assessment and medication administration. Patient denies SI/HI/AVH, pain, and depression with this Clinical research associate. Patient stated his anxiety was 5/10, see MAR. Patient was isolative to his room and only interacted with staff when this writer assessed him and gave him medications.   A - Patient was compliant with medication administration per MD orders and procedures on the unit. Patient was given education. Patient was given support and encouragement to be active in his treatment plan. Patient informed to let staff know if there are any issues or problems on the unit.   R - Patient being monitored Q 15 minutes for safety per unit protocol. Patient remains safe on the unit.

## 2019-04-12 NOTE — [facility] Group Notes (Signed)
LCSW Group Therapy Note 04/12/2019 1:15pm  Type of Therapy and Topic: Group Therapy: Feelings Around Returning Home & Establishing a Supportive Framework and Supporting Oneself When Supports Not Available  Participation Level: Active  Description of Group:  Patients first processed thoughts and feelings about upcoming discharge. These included fears of upcoming changes, lack of change, new living environments, judgements and expectations from others and overall stigma of mental health issues. The group then discussed the definition of a supportive framework, what that looks and feels like, and how do to discern it from an unhealthy non-supportive network. The group identified different types of supports as well as what to do when your family/friends are less than helpful or unavailable  Therapeutic Goals  1. Patient will identify one healthy supportive network that they can use at discharge. 2. Patient will identify one factor of a supportive framework and how to tell it from an unhealthy network. 3. Patient able to identify one coping skill to use when they do not have positive supports from others. 4. Patient will demonstrate ability to communicate their needs through discussion and/or role plays.  Summary of Patient Progress:  The patient reported he feels "good." Pt engaged during group session. As patients processed their anxiety about discharge and described healthy supports patient shared that he is not sure if he is ready to be discharge. He stated, " it still a little early and I am not stable." He listed his children as his main support.  Patients identified at least one self-care tool they were willing to use after discharge.   Therapeutic Modalities Cognitive Behavioral Therapy Motivational Interviewing   Kalman Nylen  CUEBAS-COLON, LCSW 04/12/2019 11:57 AM

## 2019-04-12 NOTE — Progress Notes (Signed)
Patient is expressing good state of mind, seeing patient in the day room with peers watching television patient is stable and alert and described anxiety as moderate and not severe due when I lay down in my bed a lot is going through my mind but don't know what it is I can't place it right [email or web address] and support is provided, patient contract for safety of self and others denies any SI/HI/and AVH, patient is compliant and maintaining safety in the unit and only requiring 15 minutes safety checks no distress.

## 2019-04-12 NOTE — Progress Notes (Signed)
[facility] MD Progress Note  04/12/2019 1:38 PM Brad Chang [place]  MRN:  [id number] Subjective: Patient seen and chart reviewed.    Pt is still quite isolated socially.  Zykera Abella still in bed most of the day, but agreed to go to Groups today.   Otherwise, no complaints and tolerates meds.   Denied SI or Hi.   Principal Problem: Severe major depression, single episode, without psychotic features (HCC) Diagnosis: Principal Problem:   Severe major depression, single episode, without psychotic features (HCC) Active Problems:   Blindness of left eye   Benign essential hypertension   Cocaine abuse (HCC)   Suicidal behavior   Alcohol abuse  Total Time spent with patient: 15 minutes  Past Psychiatric History: Patient has a history of substance abuse and depression.  Recent suicidal ideation.  Past suicide attempt.  Past Medical History:  Past Medical History:  Diagnosis Date  . Hypertension     Past Surgical History:  Procedure Laterality Date  . EYE SURGERY     Family History:  Family History  Problem Relation Age of Onset  . Hypertension Mother   . Cancer Father    Family Psychiatric  History: Depression Social History:  Social History   Substance and Sexual Activity  Alcohol Use Yes     Social History   Substance and Sexual Activity  Drug Use Never    Social History   Socioeconomic History  . Marital status: Single    Spouse name: Not on file  . Number of children: Not on file  . Years of education: Not on file  . Highest education level: Not on file  Occupational History  . Not on file  Social Needs  . Financial resource strain: Not on file  . Food insecurity:    Worry: Not on file    Inability: Not on file  . Transportation needs:    Medical: Not on file    Non-medical: Not on file  Tobacco Use  . Smoking status: Never Smoker  . Smokeless tobacco: Never Used  Substance and Sexual Activity  . Alcohol use: Yes  . Drug use: Never  . Sexual activity: Not on  file  Lifestyle  . Physical activity:    Days per week: Not on file    Minutes per session: Not on file  . Stress: Not on file  Relationships  . Social connections:    Talks on phone: Not on file    Gets together: Not on file    Attends religious service: Not on file    Active member of club or organization: Not on file    Attends meetings of clubs or organizations: Not on file    Relationship status: Not on file  Other Topics Concern  . Not on file  Social History Narrative  . Not on file   Additional Social History:   Sleep: Fair  Appetite:  Fair  Current Medications: Current Facility-Administered Medications  Medication Dose Route Frequency Provider Last Rate Last Dose  . acetaminophen (TYLENOL) tablet 650 mg  650 mg Oral Q6H PRN Mariel Craft, MD      . alum & mag hydroxide-simeth (MAALOX/MYLANTA) 200-200-20 MG/5ML suspension 30 mL  30 mL Oral Q4H PRN Mariel Craft, MD      . diphenhydrAMINE (BENADRYL) capsule 50 mg  50 mg Oral Q6H PRN Mariel Craft, MD      . losartan (COZAAR) tablet 50 mg  50 mg Oral Daily Mariel Craft, MD  50 mg at 04/12/19 1610   And  . hydrochlorothiazide (MICROZIDE) capsule 12.5 mg  12.5 mg Oral Daily Mariel Craft, MD   12.5 mg at 04/12/19 0809  . hydrOXYzine (ATARAX/VISTARIL) tablet 25 mg  25 mg Oral TID PRN Mariel Craft, MD   25 mg at 04/10/19 2112  . magnesium hydroxide (MILK OF MAGNESIA) suspension 30 mL  30 mL Oral Daily PRN Mariel Craft, MD      . mirtazapine (REMERON) tablet 30 mg  30 mg Oral QHS Clapacs, John T, MD   30 mg at 04/11/19 2142  . naproxen (NAPROSYN) tablet 500 mg  500 mg Oral BID PRN Mariel Craft, MD      . nicotine (NICODERM CQ - dosed in mg/24 hr) patch 7 mg  7 mg Transdermal Daily Clapacs, Jackquline Denmark, MD   7 mg at 04/12/19 0810  . OLANZapine zydis (ZYPREXA) disintegrating tablet 10 mg  10 mg Oral Q8H PRN Mariel Craft, MD       And  . ziprasidone (GEODON) injection 20 mg  20 mg Intramuscular PRN  Mariel Craft, MD        Lab Results:  No results found for this or any previous visit (from the past 48 hour(s)).  Blood Alcohol level:  Lab Results  Component Value Date   ETH 123 (H) 04/08/2019    Metabolic Disorder Labs: No results found for: HGBA1C, MPG No results found for: PROLACTIN No results found for: CHOL, TRIG, HDL, CHOLHDL, VLDL, LDLCALC  Physical Findings: AIMS:  , ,  ,  ,    CIWA:  CIWA-Ar Total: 2 COWS:     Musculoskeletal: Strength & Muscle Tone: within normal limits Gait & Station: normal Patient leans: N/A  Psychiatric Specialty Exam: Physical Exam  Nursing note and vitals reviewed. Constitutional: Brad Chang appears well-developed and well-nourished.  HENT:  Head: Normocephalic and atraumatic.  Eyes: Pupils are equal, round, and reactive to light. Conjunctivae are normal.  Neck: Normal range of motion.  Cardiovascular: Regular rhythm and normal heart sounds.  Respiratory: Effort normal.  GI: Soft.  Musculoskeletal: Normal range of motion.  Neurological: Brad Chang is alert.  Skin: Skin is warm and dry.  Psychiatric: Judgment normal. His speech is delayed and tangential. Brad Chang is slowed and withdrawn. Cognition and memory are impaired. Brad Chang exhibits a depressed mood. Brad Chang expresses suicidal ideation. Brad Chang expresses no suicidal plans.    Review of Systems  Constitutional: Negative.   HENT: Negative.   Eyes: Negative.   Respiratory: Negative.   Cardiovascular: Negative.   Gastrointestinal: Negative.   Musculoskeletal: Negative.   Skin: Negative.   Neurological: Negative.   Psychiatric/Behavioral: Positive for depression, substance abuse and suicidal ideas. Negative for hallucinations and memory loss. The patient is not nervous/anxious and does not have insomnia.     Blood pressure 118/70, pulse 76, temperature 97.8 F (36.6 C), temperature source Oral, resp. rate 16, height  (1.727 m), weight 83.9 kg, SpO2 99 %.Body mass index is 28.12 kg/m.  General  Appearance: Disheveled  Eye Contact:  Fair  Speech:  Slow  Volume:  Decreased  Mood:  Depressed  Affect:  Congruent  Thought Process:  Coherent  Orientation:  Full (Time, Place, and Person)  Thought Content:  Logical  Suicidal Thoughts:  Yes.  without intent/plan  Homicidal Thoughts:  No  Memory:  Immediate;   Fair Recent;   Fair Remote;   Fair  Judgement:  Fair  Insight:  Shallow  Psychomotor Activity:  Decreased  Concentration:  Concentration: Fair  Recall:  [employer]  Fund of Knowledge:  Fair  Language:  Fair  Akathisia:  No  Handed:  Right  AIMS (if indicated):     Assets:  Desire for Improvement  ADL's:  Impaired  Cognition:  Impaired,  Mild  Sleep:  Number of Hours: 4.45     Treatment Plan Summary: Daily contact with patient to assess and evaluate symptoms and progress in treatment and Medication management   # MDD, r/o SIMD -- continue Mirtazapine 30mg  qhs,  -- recommend therapy.   # polysubstance abuse (amphetmaine, cocaine, benzo, and alcohol).  -- recommend substance abuse counseling.  -- detox completed.   # Disposition: -- defer to primary team.    Amyah Clawson, MD 04/12/2019, 1:38 PM

## 2019-04-12 NOTE — Plan of Care (Signed)
Patient reports that he slept fair last night without the use of a sleep aid. Appetite is good with normal concentration and energy level . Rates his depression and anxiety a 5. Denies SI/HI/AVH and pain at this time. His goal for today is to work on his mental state of mind and to stay positive. Compliant with medication and meals. Milieu remains safe with q 15 minute safety checks.

## 2019-04-13 NOTE — [facility] Group Notes (Signed)
LCSW Group Therapy Note   04/13/2019 1:00 PM  Type of Therapy and Topic:  Group Therapy:  Overcoming Obstacles   Participation Level:  Minimal   Description of Group:    In this group patients will be encouraged to explore what they see as obstacles to their own wellness and recovery. They will be guided to discuss their thoughts, feelings, and behaviors related to these obstacles. The group will process together ways to cope with barriers, with attention given to specific choices patients can make. Each patient will be challenged to identify changes they are motivated to make in order to overcome their obstacles. This group will be process-oriented, with patients participating in exploration of their own experiences as well as giving and receiving support and challenge from other group members.   Therapeutic Goals: 1. Patient will identify personal and current obstacles as they relate to admission. 2. Patient will identify barriers that currently interfere with their wellness or overcoming obstacles.  3. Patient will identify feelings, thought process and behaviors related to these barriers. 4. Patient will identify two changes they are willing to make to overcome these obstacles:      Summary of Patient Progress Patient was present and attentive in group.  Patient discussed how being around narcicisstic people has negative impacted him.  Patient provided psycho-education to others in the group on the meaning of narcissim and provided examples.     Therapeutic Modalities:   Cognitive Behavioral Therapy Solution Focused Therapy Motivational Interviewing Relapse Prevention Therapy  Penni Homans, MSW, LCSW 04/13/2019 12:46 PM

## 2019-04-13 NOTE — Progress Notes (Signed)
Recreation Therapy Notes    Date: 04/13/2019  Time: 9:30 am  Location: Craft room  Behavioral response: Appropriate   Intervention Topic: Problem Solving  Discussion/Intervention:  Group content on today was focused on problem solving. The group described what problem solving is. Patients expressed how problems affect them and how they deal with problems. Individuals identified healthy ways to deal with problems. Patients explained what normally happens to them when they do not deal with problems. The group expressed reoccurring problems for them. The group participated in the intervention "Ways to Solve problems" where patients were given a chance to explore different ways to solve problems.   Clinical Observations/Feedback:  Patient came to group and was pulled out by Child psychotherapist. He later returned and stated problems build up if they are not solved.  Individual was social with peers and staff while participating in the intervention. Mann Skaggs LRT/CTRS         Draken Farrior 04/13/2019 11:13 AM

## 2019-04-13 NOTE — Plan of Care (Signed)
D- Patient alert and oriented. Patient presents in a pleasant mood on assessment stating that he slept "a little better" last night and had no major complaints or concerns to voice to this Clinical research associate. Patient denies any depression, however, he stated that he had "mild" anxiety, which is "typical" for him. Patient also denies SI, HI, AVH, and pain at this time. Patient's goal for today is to "focus on my self-control", in which he will "keep a positive mind" in order to accomplish his goal.  A- Scheduled medications administered to patient, per MD orders. Support and encouragement provided.  Routine safety checks conducted every 15 minutes.  Patient informed to notify staff with problems or concerns.  R- No adverse drug reactions noted. Patient contracts for safety at this time. Patient compliant with medications and treatment plan. Patient receptive, calm, and cooperative. Patient interacts well with others on the unit.  Patient remains safe at this time.  Problem: Education: Goal: Knowledge of [place] [facility] Education information/materials will improve Outcome: Progressing Goal: Emotional status will improve Outcome: Progressing Goal: Mental status will improve Outcome: Progressing Goal: Verbalization of understanding the information provided will improve Outcome: Progressing   Problem: Safety: Goal: Periods of time without injury will increase Outcome: Progressing   Problem: Education: Goal: Ability to make informed decisions regarding treatment will improve Outcome: Progressing   Problem: Medication: Goal: Compliance with prescribed medication regimen will improve Outcome: Progressing

## 2019-04-13 NOTE — Progress Notes (Signed)
[facility] MD Progress Note  04/13/2019 4:02 PM Brad Chang  MRN:  [id number] Subjective: Patient seen chart reviewed.  Follow-up for this patient with depression and substance abuse.  Patient reports his mood is feeling better.  Denies suicidal ideation.  Still feeling fatigued and spends a lot of time in his room.  Still has some lack of confidence about substance abuse problems.  Blood pressure under better control.  It remains enthusiastic about inpatient treatment Principal Problem: Severe major depression, single episode, without psychotic features (HCC) Diagnosis: Principal Problem:   Severe major depression, single episode, without psychotic features (HCC) Active Problems:   Blindness of left eye   Benign essential hypertension   Cocaine abuse (HCC)   Suicidal behavior   Alcohol abuse  Total Time spent with patient: 30 minutes  Past Psychiatric History: Patient has a history of longstanding depression and substance abuse with minimal past treatment  Past Medical History:  Past Medical History:  Diagnosis Date  . Hypertension     Past Surgical History:  Procedure Laterality Date  . EYE SURGERY     Family History:  Family History  Problem Relation Age of Onset  . Hypertension Mother   . Cancer Father    Family Psychiatric  History: See previous Social History:  Social History   Substance and Sexual Activity  Alcohol Use Yes     Social History   Substance and Sexual Activity  Drug Use Never    Social History   Socioeconomic History  . Marital status: Single    Spouse name: Not on file  . Number of children: Not on file  . Years of education: Not on file  . Highest education level: Not on file  Occupational History  . Not on file  Social Needs  . Financial resource strain: Not on file  . Food insecurity:    Worry: Not on file    Inability: Not on file  . Transportation needs:    Medical: Not on file    Non-medical: Not on file  Tobacco Use  .  Smoking status: Never Smoker  . Smokeless tobacco: Never Used  Substance and Sexual Activity  . Alcohol use: Yes  . Drug use: Never  . Sexual activity: Not on file  Lifestyle  . Physical activity:    Days per week: Not on file    Minutes per session: Not on file  . Stress: Not on file  Relationships  . Social connections:    Talks on phone: Not on file    Gets together: Not on file    Attends religious service: Not on file    Active member of club or organization: Not on file    Attends meetings of clubs or organizations: Not on file    Relationship status: Not on file  Other Topics Concern  . Not on file  Social History Narrative  . Not on file   Additional Social History:                         Sleep: Fair  Appetite:  Fair  Current Medications: Current Facility-Administered Medications  Medication Dose Route Frequency Provider Last Rate Last Dose  . acetaminophen (TYLENOL) tablet 650 mg  650 mg Oral Q6H PRN Mariel Craft, MD      . alum & mag hydroxide-simeth (MAALOX/MYLANTA) 200-200-20 MG/5ML suspension 30 mL  30 mL Oral Q4H PRN Mariel Craft, MD      .  diphenhydrAMINE (BENADRYL) capsule 50 mg  50 mg Oral Q6H PRN Mariel CraftMaurer, Sheila M, MD      . losartan (COZAAR) tablet 50 mg  50 mg Oral Daily Mariel CraftMaurer, Sheila M, MD   50 mg at 04/13/19 16100853   And  . hydrochlorothiazide (MICROZIDE) capsule 12.5 mg  12.5 mg Oral Daily Mariel CraftMaurer, Sheila M, MD   12.5 mg at 04/13/19 0853  . hydrOXYzine (ATARAX/VISTARIL) tablet 25 mg  25 mg Oral TID PRN Mariel CraftMaurer, Sheila M, MD   25 mg at 04/10/19 2112  . magnesium hydroxide (MILK OF MAGNESIA) suspension 30 mL  30 mL Oral Daily PRN Mariel CraftMaurer, Sheila M, MD      . mirtazapine (REMERON) tablet 30 mg  30 mg Oral QHS Shown Dissinger T, MD   30 mg at 04/12/19 2116  . naproxen (NAPROSYN) tablet 500 mg  500 mg Oral BID PRN Mariel CraftMaurer, Sheila M, MD      . nicotine (NICODERM CQ - dosed in mg/24 hr) patch 7 mg  7 mg Transdermal Daily Malaiya Paczkowski, Jackquline DenmarkJohn T, MD   7  mg at 04/13/19 0854  . OLANZapine zydis (ZYPREXA) disintegrating tablet 10 mg  10 mg Oral Q8H PRN Mariel CraftMaurer, Sheila M, MD       And  . ziprasidone (GEODON) injection 20 mg  20 mg Intramuscular PRN Mariel CraftMaurer, Sheila M, MD        Lab Results: No results found for this or any previous visit (from the past 48 hour(s)).  Blood Alcohol level:  Lab Results  Component Value Date   ETH 123 (H) 04/08/2019    Metabolic Disorder Labs: No results found for: HGBA1C, MPG No results found for: PROLACTIN No results found for: CHOL, TRIG, HDL, CHOLHDL, VLDL, LDLCALC  Physical Findings: AIMS:  , ,  ,  ,    CIWA:  CIWA-Ar Total: 0 COWS:     Musculoskeletal: Strength & Muscle Tone: within normal limits Gait & Station: normal Patient leans: N/A  Psychiatric Specialty Exam: Physical Exam  Nursing note and vitals reviewed. Constitutional: He appears well-developed and well-nourished.  HENT:  Head: Normocephalic and atraumatic.  Eyes: Pupils are equal, round, and reactive to light. Conjunctivae are normal.  Neck: Normal range of motion.  Cardiovascular: Regular rhythm and normal heart sounds.  Respiratory: Effort normal.  GI: Soft.  Musculoskeletal: Normal range of motion.  Neurological: He is alert.  Skin: Skin is warm and dry.  Psychiatric: Judgment normal. His affect is blunt. His speech is delayed. He is slowed. Cognition and memory are normal. He expresses no homicidal and no suicidal ideation.    Review of Systems  Constitutional: Negative.   HENT: Negative.   Eyes: Negative.   Respiratory: Negative.   Cardiovascular: Negative.   Gastrointestinal: Negative.   Musculoskeletal: Negative.   Skin: Negative.   Neurological: Negative.   Psychiatric/Behavioral: Negative.     Blood pressure 121/83, pulse 62, temperature 98 F (36.7 C), temperature source Oral, resp. rate 18, height 5\' 8"  (1.727 m), weight 83.9 kg, SpO2 99 %.Body mass index is 28.12 kg/m.  General Appearance: Fairly Groomed   Eye Contact:  Fair  Speech:  Clear and Coherent  Volume:  Normal  Mood:  Euthymic  Affect:  Congruent  Thought Process:  Goal Directed  Orientation:  Full (Time, Place, and Person)  Thought Content:  Logical  Suicidal Thoughts:  No  Homicidal Thoughts:  No  Memory:  Immediate;   Fair Recent;   Fair Remote;   Fair  Judgement:  Fair  Insight:  Fair  Psychomotor Activity:  Normal  Concentration:  Concentration: Fair  Recall:  [employer] of Knowledge:  Fair  Language:  Fair  Akathisia:  No  Handed:  Right  AIMS (if indicated):     Assets:  Desire for Improvement Resilience  ADL's:  Intact  Cognition:  WNL  Sleep:  Number of Hours: 7     Treatment Plan Summary: Daily contact with patient to assess and evaluate symptoms and progress in treatment, Medication management and Plan Patient seen and chart reviewed.  Patient reports mood is feeling better.  No need to change any medication.  Meds being tolerated without difficulty.  Patient is accepted at the [facility] drug abuse treatment Center and we are moving towards admission hopefully in the next couple days.  Continue to encourage patient to attend substance abuse groups  Mordecai Rasmussen, MD 04/13/2019, 4:02 PM

## 2019-04-14 NOTE — Progress Notes (Signed)
Patient is seeing in the milieu with peers socializing and watching television, mood is good and affect is appropriate, medication is taking with out any issues and no side effects, patient denies suicidal ,homisidal ideation and no physical concerns, sleep is continuous only requiring 15 minutes safety checks no distress.

## 2019-04-14 NOTE — Progress Notes (Signed)
D: Patient is pleasant.  He continue to wait for a bed at [facility].  He denies any withdrawal symptoms.  Patient has a prostitic eye.  He denies any thoughts of self harm/HI/AVH.  He is compliant with his medications.  He states his mood is stable today.  He is attending group and appears to be interacting well with his peers.  Continue to monitor.  A: Continue to monitor medication management and MD orders.  Safety checks completed every 15 minutes per protocol.  Offer support and encouragement as needed.  R: Patient is receptive to staff; his behavior is appropriate.

## 2019-04-14 NOTE — Progress Notes (Signed)
[facility] MD Progress Note  04/14/2019 3:41 PM Brad Chang  MRN:  [id number] Subjective: Follow-up for this patient with depression.  Patient reports mood is feeling better.  He is attending groups and feels like they are helpful.  Less angry less depressed.  Better hygiene.  Affect euthymic.  Not having any current suicidal ideation.  Still agreeable to going to the alcohol and drug abuse treatment center. Principal Problem: Severe major depression, single episode, without psychotic features (HCC) Diagnosis: Principal Problem:   Severe major depression, single episode, without psychotic features (HCC) Active Problems:   Blindness of left eye   Benign essential hypertension   Cocaine abuse (HCC)   Suicidal behavior   Alcohol abuse  Total Time spent with patient: 20 minutes  Past Psychiatric History: No past treatment history but it sounds like a longstanding depression and substance abuse problem  Past Medical History:  Past Medical History:  Diagnosis Date  . Hypertension     Past Surgical History:  Procedure Laterality Date  . EYE SURGERY     Family History:  Family History  Problem Relation Age of Onset  . Hypertension Mother   . Cancer Father    Family Psychiatric  History: See previous Social History:  Social History   Substance and Sexual Activity  Alcohol Use Yes     Social History   Substance and Sexual Activity  Drug Use Never    Social History   Socioeconomic History  . Marital status: Single    Spouse name: Not on file  . Number of children: Not on file  . Years of education: Not on file  . Highest education level: Not on file  Occupational History  . Not on file  Social Needs  . Financial resource strain: Not on file  . Food insecurity:    Worry: Not on file    Inability: Not on file  . Transportation needs:    Medical: Not on file    Non-medical: Not on file  Tobacco Use  . Smoking status: Never Smoker  . Smokeless tobacco: Never Used   Substance and Sexual Activity  . Alcohol use: Yes  . Drug use: Never  . Sexual activity: Not on file  Lifestyle  . Physical activity:    Days per week: Not on file    Minutes per session: Not on file  . Stress: Not on file  Relationships  . Social connections:    Talks on phone: Not on file    Gets together: Not on file    Attends religious service: Not on file    Active member of club or organization: Not on file    Attends meetings of clubs or organizations: Not on file    Relationship status: Not on file  Other Topics Concern  . Not on file  Social History Narrative  . Not on file   Additional Social History:                         Sleep: Fair  Appetite:  Fair  Current Medications: Current Facility-Administered Medications  Medication Dose Route Frequency Provider Last Rate Last Dose  . acetaminophen (TYLENOL) tablet 650 mg  650 mg Oral Q6H PRN Mariel Craft, MD      . alum & mag hydroxide-simeth (MAALOX/MYLANTA) 200-200-20 MG/5ML suspension 30 mL  30 mL Oral Q4H PRN Mariel Craft, MD      . diphenhydrAMINE (BENADRYL) capsule 50 mg  50  mg Oral Q6H PRN Mariel Craft, MD      . losartan (COZAAR) tablet 50 mg  50 mg Oral Daily Mariel Craft, MD   50 mg at 04/14/19 0900   And  . hydrochlorothiazide (MICROZIDE) capsule 12.5 mg  12.5 mg Oral Daily Mariel Craft, MD   12.5 mg at 04/14/19 0900  . hydrOXYzine (ATARAX/VISTARIL) tablet 25 mg  25 mg Oral TID PRN Mariel Craft, MD   25 mg at 04/14/19 1511  . magnesium hydroxide (MILK OF MAGNESIA) suspension 30 mL  30 mL Oral Daily PRN Mariel Craft, MD      . mirtazapine (REMERON) tablet 30 mg  30 mg Oral QHS Damariz Paganelli T, MD   30 mg at 04/13/19 2130  . naproxen (NAPROSYN) tablet 500 mg  500 mg Oral BID PRN Mariel Craft, MD      . nicotine (NICODERM CQ - dosed in mg/24 hr) patch 7 mg  7 mg Transdermal Daily Shon Indelicato, Jackquline Denmark, MD   7 mg at 04/14/19 0859  . OLANZapine zydis (ZYPREXA)  disintegrating tablet 10 mg  10 mg Oral Q8H PRN Mariel Craft, MD       And  . ziprasidone (GEODON) injection 20 mg  20 mg Intramuscular PRN Mariel Craft, MD        Lab Results: No results found for this or any previous visit (from the past 48 hour(s)).  Blood Alcohol level:  Lab Results  Component Value Date   ETH 123 (H) 04/08/2019    Metabolic Disorder Labs: No results found for: HGBA1C, MPG No results found for: PROLACTIN No results found for: CHOL, TRIG, HDL, CHOLHDL, VLDL, LDLCALC  Physical Findings: AIMS:  , ,  ,  ,    CIWA:  CIWA-Ar Total: 0 COWS:     Musculoskeletal: Strength & Muscle Tone: within normal limits Gait & Station: normal Patient leans: N/A  Psychiatric Specialty Exam: Physical Exam  Nursing note and vitals reviewed. Constitutional: He appears well-developed and well-nourished.  HENT:  Head: Normocephalic and atraumatic.  Eyes: Pupils are equal, round, and reactive to light. Conjunctivae are normal.  Neck: Normal range of motion.  Cardiovascular: Regular rhythm and normal heart sounds.  Respiratory: Effort normal.  GI: Soft.  Musculoskeletal: Normal range of motion.  Neurological: He is alert.  Skin: Skin is warm and dry.  Psychiatric: He has a normal mood and affect. His behavior is normal. Judgment and thought content normal.    Review of Systems  Constitutional: Negative.   HENT: Negative.   Eyes: Negative.   Respiratory: Negative.   Cardiovascular: Negative.   Gastrointestinal: Negative.   Musculoskeletal: Negative.   Skin: Negative.   Neurological: Negative.   Psychiatric/Behavioral: Negative.     Blood pressure 116/85, pulse 71, temperature 98 F (36.7 C), temperature source Oral, resp. rate 16, height  (1.727 m), weight 83.9 kg, SpO2 99 %.Body mass index is 28.12 kg/m.  General Appearance: Casual  Eye Contact:  Fair  Speech:  Clear and Coherent  Volume:  Normal  Mood:  Euthymic  Affect:  Congruent  Thought  Process:  Goal Directed  Orientation:  Full (Time, Place, and Person)  Thought Content:  Logical  Suicidal Thoughts:  No  Homicidal Thoughts:  No  Memory:  Immediate;   Fair Recent;   Fair Remote;   Fair  Judgement:  Fair  Insight:  Fair  Psychomotor Activity:  Decreased  Concentration:  Concentration: Fair  Recall:  Fair  [employer] of Knowledge:  Fair  Language:  Fair  Akathisia:  No  Handed:  Right  AIMS (if indicated):     Assets:  Desire for Improvement Physical Health  ADL's:  Intact  Cognition:  WNL  Sleep:  Number of Hours: 6.75     Treatment Plan Summary: Daily contact with patient to assess and evaluate symptoms and progress in treatment, Medication management and Plan Tolerating medication well.  No change to medicine.  Continue group therapy.  He has been accepted to the alcohol and drug abuse treatment program and we should have a bed date within the next day or so.  Patient is cooperative and agreeable.  Mordecai RasmussenJohn Dalani Mette, MD 04/14/2019, 3:41 PM

## 2019-04-14 NOTE — Progress Notes (Signed)
Recreation Therapy Notes  Date: 04/14/2019  Time: 9:30 am   Location: Craft room   Behavioral response: N/A   Intervention Topic: Self-esteem  Discussion/Intervention: Patient did not attend group.   Clinical Observations/Feedback:  Patient did not attend group.   Maille Halliwell LRT/CTRS        Philomena Buttermore 04/14/2019 10:59 AM

## 2019-04-14 NOTE — Progress Notes (Signed)
D: Patient has been pleasant and cooperative. Denies withdrawal symptoms, SI and HI. Is looking forward to going to [facility] and has asked for more information about the program. Less isolative today. Still reporting some anxiety. A: Continue to monitor for safety. R: Safety maintained

## 2019-04-14 NOTE — Plan of Care (Signed)
  Problem: Education: Goal: Knowledge of [place] [facility] Education information/materials will improve Outcome: Progressing Goal: Emotional status will improve Outcome: Progressing Goal: Mental status will improve Outcome: Progressing Goal: Verbalization of understanding the information provided will improve Outcome: Progressing  D: Patient has been pleasant and cooperative. Denies withdrawal symptoms, SI and HI. Is looking forward to going to [facility] and has asked for more information about the program. Less isolative today. Still reporting some anxiety. A: Continue to monitor for safety. R: Safety maintained

## 2019-04-14 NOTE — BHH Counselor (Signed)
CSW called [facility] and was informed that patient has been approved but still awaiting a bed.  Penni Homans, MSW, LCSW 04/14/2019 8:23 AM

## 2019-04-14 NOTE — B[facility] Group Notes (Signed)
Feelings Around Diagnosis 04/14/2019 1PM  Type of Therapy/Topic:  Group Therapy:  Feelings about Diagnosis  Participation Level:  Active   Description of Group:   This group will allow patients to explore their thoughts and feelings about diagnoses they have received. Patients will be guided to explore their level of understanding and acceptance of these diagnoses. Facilitator will encourage patients to process their thoughts and feelings about the reactions of others to their diagnosis and will guide patients in identifying ways to discuss their diagnosis with significant others in their lives. This group will be process-oriented, with patients participating in exploration of their own experiences, giving and receiving support, and processing challenge from other group members.   Therapeutic Goals: 1. Patient will demonstrate understanding of diagnosis as evidenced by identifying two or more symptoms of the disorder 2. Patient will be able to express two feelings regarding the diagnosis 3. Patient will demonstrate their ability to communicate their needs through discussion and/or role play  Summary of Patient Progress:  Actively and appropriately engaged in the group. Patient was able to provide support and validation to other group members.Patient practiced active listening when interacting with the facilitator and other group members. Patient discussed with group his tendency to bottle his emotions. Patient reports he has a history of displaying anger when he bottles his emotion. Patient took notes during session agrees to apply healthy coping skills discussed to help manage his feelings.       Therapeutic Modalities:   Cognitive Behavioral Therapy Brief Therapy Feelings Identification    Suzan Slick, LCSW 04/14/2019 1:59 PM

## 2019-04-15 MED ORDER — LOSARTAN POTASSIUM 50 MG PO TABS: 50.0000 mg | ORAL_TABLET | Freq: Every day | ORAL | 0 refills | Status: AC

## 2019-04-15 MED ORDER — HYDROCHLOROTHIAZIDE 12.5 MG PO CAPS: 12.5000 mg | ORAL_CAPSULE | Freq: Every day | ORAL | 0 refills | Status: AC

## 2019-04-15 MED ORDER — NAPROXEN 500 MG PO TABS: 500.0000 mg | ORAL_TABLET | Freq: Two times a day (BID) | ORAL | 0 refills | Status: AC | PRN

## 2019-04-15 MED ORDER — MIRTAZAPINE 30 MG PO TABS: 30.0000 mg | ORAL_TABLET | Freq: Every day | ORAL | 0 refills | Status: AC

## 2019-04-15 NOTE — Plan of Care (Signed)
Patient  aware of [place] Education , unit programing , able to verbalize understanding of information received. Mental and emotional status improved. Engaging in activities on the unit .  Marland Kitchen  Voice of no safety concerns . Continue to work on coping  and Tourist information centre manager .Compliant  With medication  Aware resources to go to [facility] tomorrow     Problem: Education: Goal: Knowledge of [place] [facility] Education information/materials will improve Outcome: Progressing Goal: Emotional status will improve Outcome: Progressing Goal: Mental status will improve Outcome: Progressing Goal: Verbalization of understanding the information provided will improve Outcome: Progressing   Problem: Safety: Goal: Periods of time without injury will increase Outcome: Progressing   Problem: Education: Goal: Ability to make informed decisions regarding treatment will improve Outcome: Progressing   Problem: Medication: Goal: Compliance with prescribed medication regimen will improve Outcome: Progressing

## 2019-04-15 NOTE — Progress Notes (Signed)
D: Patient stated slept fair last night .Stated appetite good and energy level  Is normal. Stated concentration is good . Stated on Depression scale 3 , hopeless  0and anxiety 6 .( low 0-10 high) Denies suicidal  homicidal ideations  .  No auditory hallucinations  No pain concerns . Appropriate ADL'S. Interacting with peers and staff.Patient  aware of [place] Education , unit programing , able to verbalize understanding of information received. Mental and emotional status improved. Engaging in activities on the unit .  Marland Kitchen  Voice of no safety concerns . Continue to work on coping  and Tourist information centre manager .Compliant  With medication  Aware resources to go to [facility] tomorrow      A: Encourage patient participation with unit programming . Instruction  Given on  Medication , verbalize understanding. R: Voice no other concerns. Staff continue to monitor

## 2019-04-15 NOTE — B[facility] Counselor (Signed)
CSW called to check on bed availability. CSW was informed that patient could have a bed 04/16/2019 at 1:30pm.   Penni Homans, MSW, LCSW 04/15/2019 9:37 AM

## 2019-04-15 NOTE — B[facility] Group Notes (Signed)
B[facility] Group Notes:  (Nursing/MHT/Case Management/Adjunct)  Date:  04/15/2019  Time:  9:41 PM  Type of Therapy:  Group Therapy  Participation Level:  Active  Participation Quality:  Appropriate  Affect:  Appropriate  Cognitive:  Alert  Insight:  Good  Engagement in Group:  Engaged  Modes of Intervention:  Support  Summary of Progress/Problems:  Brad Chang 04/15/2019, 9:41 PM

## 2019-04-15 NOTE — BHH Suicide Risk Assessment (Signed)
[facility] Discharge Suicide Risk Assessment   Principal Problem: Severe major depression, single episode, without psychotic features (HCC) Discharge Diagnoses: Principal Problem:   Severe major depression, single episode, without psychotic features (HCC) Active Problems:   Blindness of left eye   Benign essential hypertension   Cocaine abuse (HCC)   Suicidal behavior   Alcohol abuse   Total Time spent with patient: 45 minutes  Musculoskeletal: Strength & Muscle Tone: within normal limits Gait & Station: normal Patient leans: N/A  Psychiatric Specialty Exam: Review of Systems  Constitutional: Negative.   HENT: Negative.   Eyes: Negative.   Respiratory: Negative.   Cardiovascular: Negative.   Gastrointestinal: Negative.   Musculoskeletal: Negative.   Skin: Negative.   Neurological: Negative.   Psychiatric/Behavioral: Negative.     Blood pressure 110/76, pulse (!) 59, temperature 98.3 F (36.8 C), temperature source Oral, resp. rate 18, height 5\' 8"  (1.727 m), weight 83.9 kg, SpO2 97 %.Body mass index is 28.12 kg/m.  General Appearance: Casual  Eye Contact::  Fair  Speech:  Clear and Coherent409  Volume:  Normal  Mood:  Euthymic  Affect:  Appropriate  Thought Process:  Goal Directed  Orientation:  Full (Time, Place, and Person)  Thought Content:  Logical  Suicidal Thoughts:  No  Homicidal Thoughts:  No  Memory:  Immediate;   Fair Recent;   Fair Remote;   Fair  Judgement:  Fair  Insight:  Fair  Psychomotor Activity:  Normal  Concentration:  Fair  Recall:  [employer] of Knowledge:Fair  Language: Fair  Akathisia:  No  Handed:  Right  AIMS (if indicated):     Assets:  Desire for Improvement  Sleep:  Number of Hours: 5.75  Cognition: WNL  ADL's:  Intact   Mental Status Per Nursing Assessment::   On Admission:  NA  Demographic Factors:  Male  Loss Factors: Loss of significant relationship  Historical Factors: Impulsivity  Risk Reduction Factors:    Religious beliefs about death  Continued Clinical Symptoms:  Depression:   Comorbid alcohol abuse/dependence Alcohol/Substance Abuse/Dependencies  Cognitive Features That Contribute To Risk:  None    Suicide Risk:  Minimal: No identifiable suicidal ideation.  Patients presenting with no risk factors but with morbid ruminations; may be classified as minimal risk based on the severity of the depressive symptoms  Follow-up Information    [facility] Follow up.   Contact information: [address] [place] [address] [phone]           Plan Of Care/Follow-up recommendations:  Activity:  As tolerated Diet:  Regular Other:  Follow-up with alcohol and drug abuse treatment center  Mordecai Rasmussen, MD 04/15/2019, 4:47 PM

## 2019-04-15 NOTE — Tx Team (Signed)
Interdisciplinary Treatment and Diagnostic Plan Update  04/15/2019 Time of Session: 8:30am Brad SpeckingWilliam K Chang MRN: [id number]  Principal Diagnosis: Severe major depression, single episode, without psychotic features (HCC)  Secondary Diagnoses: Principal Problem:   Severe major depression, single episode, without psychotic features (HCC) Active Problems:   Blindness of left eye   Benign essential hypertension   Cocaine abuse (HCC)   Suicidal behavior   Alcohol abuse   Current Medications:  Current Facility-Administered Medications  Medication Dose Route Frequency Provider Last Rate Last Dose  . acetaminophen (TYLENOL) tablet 650 mg  650 mg Oral Q6H PRN Mariel CraftMaurer, Sheila M, MD      . alum & mag hydroxide-simeth (MAALOX/MYLANTA) 200-200-20 MG/5ML suspension 30 mL  30 mL Oral Q4H PRN Mariel CraftMaurer, Sheila M, MD      . diphenhydrAMINE (BENADRYL) capsule 50 mg  50 mg Oral Q6H PRN Mariel CraftMaurer, Sheila M, MD   50 mg at 04/14/19 2221  . losartan (COZAAR) tablet 50 mg  50 mg Oral Daily Mariel CraftMaurer, Sheila M, MD   50 mg at 04/15/19 [id number]   And  . hydrochlorothiazide (MICROZIDE) capsule 12.5 mg  12.5 mg Oral Daily Mariel CraftMaurer, Sheila M, MD   12.5 mg at 04/15/19 [id number]  . hydrOXYzine (ATARAX/VISTARIL) tablet 25 mg  25 mg Oral TID PRN Mariel CraftMaurer, Sheila M, MD   25 mg at 04/15/19 1231  . magnesium hydroxide (MILK OF MAGNESIA) suspension 30 mL  30 mL Oral Daily PRN Mariel CraftMaurer, Sheila M, MD      . mirtazapine (REMERON) tablet 30 mg  30 mg Oral QHS Clapacs, John T, MD   30 mg at 04/14/19 2141  . naproxen (NAPROSYN) tablet 500 mg  500 mg Oral BID PRN Mariel CraftMaurer, Sheila M, MD      . nicotine (NICODERM CQ - dosed in mg/24 hr) patch 7 mg  7 mg Transdermal Daily Clapacs, Jackquline DenmarkJohn T, MD   7 mg at 04/15/19 0810  . OLANZapine zydis (ZYPREXA) disintegrating tablet 10 mg  10 mg Oral Q8H PRN Mariel CraftMaurer, Sheila M, MD       And  . ziprasidone (GEODON) injection 20 mg  20 mg Intramuscular PRN Mariel CraftMaurer, Sheila M, MD       PTA Medications: Medications Prior to  Admission  Medication Sig Dispense Refill Last Dose  . cyclobenzaprine (FLEXERIL) 10 MG tablet Take 1/2 to 1 whole tablet by mouth every 8 hours as needed for muscle pain/spasms. (Patient not taking: Reported on 04/08/2019) 15 tablet 0 Completed Course at Unknown time  . naproxen (NAPROSYN) 500 MG tablet Take 1 tablet (500 mg total) by mouth 2 (two) times daily as needed for moderate pain or headache. (Patient not taking: Reported on 04/08/2019) 20 tablet 0 Completed Course at Unknown time    Patient Stressors: Medication change or noncompliance Substance abuse  Patient Strengths: Motivation for treatment/growth Supportive family/friends  Treatment Modalities: Medication Management, Group therapy, Case management,  1 to 1 session with clinician, Psychoeducation, Recreational therapy.   Physician Treatment Plan for Primary Diagnosis: Severe major depression, single episode, without psychotic features (HCC) Long Term Goal(s): Improvement in symptoms so as ready for discharge Improvement in symptoms so as ready for discharge   Short Term Goals: Ability to disclose and discuss suicidal ideas Ability to demonstrate self-control will improve Ability to identify and develop effective coping behaviors will improve Ability to identify triggers associated with substance abuse/mental health issues will improve  Medication Management: Evaluate patient's response, side effects, and tolerance of medication regimen.  Therapeutic Interventions: 1  to 1 sessions, Unit Group sessions and Medication administration.  Evaluation of Outcomes: Progressing  Physician Treatment Plan for Secondary Diagnosis: Principal Problem:   Severe major depression, single episode, without psychotic features (HCC) Active Problems:   Blindness of left eye   Benign essential hypertension   Cocaine abuse (HCC)   Suicidal behavior   Alcohol abuse  Long Term Goal(s): Improvement in symptoms so as ready for  discharge Improvement in symptoms so as ready for discharge   Short Term Goals: Ability to disclose and discuss suicidal ideas Ability to demonstrate self-control will improve Ability to identify and develop effective coping behaviors will improve Ability to identify triggers associated with substance abuse/mental health issues will improve     Medication Management: Evaluate patient's response, side effects, and tolerance of medication regimen.  Therapeutic Interventions: 1 to 1 sessions, Unit Group sessions and Medication administration.  Evaluation of Outcomes: Progressing   RN Treatment Plan for Primary Diagnosis: Severe major depression, single episode, without psychotic features (HCC) Long Term Goal(s): Knowledge of disease and therapeutic regimen to maintain health will improve  Short Term Goals: Ability to verbalize frustration and anger appropriately will improve, Ability to demonstrate self-control, Ability to participate in decision making will improve, Ability to verbalize feelings will improve, Ability to disclose and discuss suicidal ideas and Ability to identify and develop effective coping behaviors will improve  Medication Management: RN will administer medications as ordered by provider, will assess and evaluate patient's response and provide education to patient for prescribed medication. RN will report any adverse and/or side effects to prescribing provider.  Therapeutic Interventions: 1 on 1 counseling sessions, Psychoeducation, Medication administration, Evaluate responses to treatment, Monitor vital signs and CBGs as ordered, Perform/monitor CIWA, COWS, AIMS and Fall Risk screenings as ordered, Perform wound care treatments as ordered.  Evaluation of Outcomes: Progressing   LCSW Treatment Plan for Primary Diagnosis: Severe major depression, single episode, without psychotic features (HCC) Long Term Goal(s): Safe transition to appropriate next level of care at  discharge, Engage patient in therapeutic group addressing interpersonal concerns.  Short Term Goals: Engage patient in aftercare planning with referrals and resources, Increase social support, Increase ability to appropriately verbalize feelings, Increase emotional regulation, Facilitate acceptance of mental health diagnosis and concerns and Identify triggers associated with mental health/substance abuse issues  Therapeutic Interventions: Assess for all discharge needs, 1 to 1 time with Social worker, Explore available resources and support systems, Assess for adequacy in community support network, Educate family and significant other(s) on suicide prevention, Complete Psychosocial Assessment, Interpersonal group therapy.  Evaluation of Outcomes: Progressing   Progress in Treatment: Attending groups: No. Participating in groups: No. Taking medication as prescribed: Yes. Toleration medication: Yes. Family/Significant other contact made: Yes, individual(s) contacted:  SPE completed with pt's wife Patient understands diagnosis: Yes. Discussing patient identified problems/goals with staff: Yes. Medical problems stabilized or resolved: Yes. Denies suicidal/homicidal ideation: Yes. Issues/concerns per patient self-inventory: No. Other: none  New problem(s) identified: No, Describe:  none  New Short Term/Long Term Goal(s): detox, medication management for mood stabilization; elimination of SI thoughts; development of comprehensive mental wellness/sobriety plan.  Patient Goals:  "get these weird feelings out of my head I guess"  Discharge Plan or Barriers: Patient has been approved for [facility] and has intake 04/16/2019 at 1:30pm.   Reason for Continuation of Hospitalization: Aggression Anxiety Depression Medication stabilization Suicidal ideation Withdrawal symptoms  Estimated Length of Stay: 1-5 days  Recreational Therapy: Patient Stressors: Relationship Patient Goal: Patient will  engage in  interactions with peers and staff in pro-social manner at least 2x within 5 recreation therapy group sessions  Attendees: Patient:  04/10/2019 3:05 PM  Physician: Dr. Toni Amend, MD 04/10/2019 3:05 PM  Nursing:  04/10/2019 3:05 PM  RN Care Manager: 04/10/2019 3:05 PM  Social Worker: Penni Homans, MSW, LCSW 04/10/2019 3:05 PM  Recreational Therapist:  04/10/2019 3:05 PM  Other:  04/10/2019 3:05 PM  Other:  04/10/2019 3:05 PM  Other: 04/10/2019 3:05 PM      Scribe for Treatment Team: Harden Mo, LCSW 04/15/2019 3:48 PM

## 2019-04-15 NOTE — Progress Notes (Signed)
Recreation Therapy Notes   Date: 04/15/2019  Time: 9:30 am   Location: Craft room   Behavioral response: N/A   Intervention Topic: Stress  Discussion/Intervention: Patient did not attend group.   Clinical Observations/Feedback:  Patient did not attend group.   Cristle Jared LRT/CTRS        Lakela Kuba 04/15/2019 10:45 AM 

## 2019-04-15 NOTE — Progress Notes (Signed)
[facility]  04/15/2019 4:34 PM Brad Chang  MRN:  [id number] Subjective: Patient has no new complaints.  Mood is good.  No suicidal thoughts no psychotic symptoms.  Physically stable. Principal Problem: Severe major depression, single episode, without psychotic features (HCC) Diagnosis: Principal Problem:   Severe major depression, single episode, without psychotic features (HCC) Active Problems:   Blindness of left eye   Benign essential hypertension   Cocaine abuse (HCC)   Suicidal behavior   Alcohol abuse  Total Time spent with patient: 15 minutes  Past Psychiatric History: History of substance abuse and depression  Past Medical History:  Past Medical History:  Diagnosis Date  . Hypertension     Past Surgical History:  Procedure Laterality Date  . EYE SURGERY     Family History:  Family History  Problem Relation Age of Onset  . Hypertension Mother   . Cancer Father    Family Psychiatric  History: See previous Social History:  Social History   Substance and Sexual Activity  Alcohol Use Yes     Social History   Substance and Sexual Activity  Drug Use Never    Social History   Socioeconomic History  . Marital status: Single    Spouse name: Not on file  . Number of children: Not on file  . Years of education: Not on file  . Highest education level: Not on file  Occupational History  . Not on file  Social Needs  . Financial resource strain: Not on file  . Food insecurity:    Worry: Not on file    Inability: Not on file  . Transportation needs:    Medical: Not on file    Non-medical: Not on file  Tobacco Use  . Smoking status: Never Smoker  . Smokeless tobacco: Never Used  Substance and Sexual Activity  . Alcohol use: Yes  . Drug use: Never  . Sexual activity: Not on file  Lifestyle  . Physical activity:    Days per week: Not on file    Minutes per session: Not on file  . Stress: Not on file  Relationships  . Social  connections:    Talks on phone: Not on file    Gets together: Not on file    Attends religious service: Not on file    Active member of club or organization: Not on file    Attends meetings of clubs or organizations: Not on file    Relationship status: Not on file  Other Topics Concern  . Not on file  Social History Narrative  . Not on file   Additional Social History:                         Sleep: Fair  Appetite:  Fair  Current Medications: Current Facility-Administered Medications  Medication Dose Route Frequency Provider Last Rate Last Dose  . acetaminophen (TYLENOL) tablet 650 mg  650 mg Oral Q6H PRN Mariel CraftMaurer, Sheila M, MD      . alum & mag hydroxide-simeth (MAALOX/MYLANTA) 200-200-20 MG/5ML suspension 30 mL  30 mL Oral Q4H PRN Mariel CraftMaurer, Sheila M, MD      . diphenhydrAMINE (BENADRYL) capsule 50 mg  50 mg Oral Q6H PRN Mariel CraftMaurer, Sheila M, MD   50 mg at 04/14/19 2221  . losartan (COZAAR) tablet 50 mg  50 mg Oral Daily Mariel CraftMaurer, Sheila M, MD   50 mg at 04/15/19 [id number]   And  . hydrochlorothiazide (MICROZIDE)  capsule 12.5 mg  12.5 mg Oral Daily Mariel Craft, MD   12.5 mg at 04/15/19 6761  . hydrOXYzine (ATARAX/VISTARIL) tablet 25 mg  25 mg Oral TID PRN Mariel Craft, MD   25 mg at 04/15/19 1231  . magnesium hydroxide (MILK OF MAGNESIA) suspension 30 mL  30 mL Oral Daily PRN Mariel Craft, MD      . mirtazapine (REMERON) tablet 30 mg  30 mg Oral QHS Layna Roeper T, MD   30 mg at 04/14/19 2141  . naproxen (NAPROSYN) tablet 500 mg  500 mg Oral BID PRN Mariel Craft, MD      . nicotine (NICODERM CQ - dosed in mg/24 hr) patch 7 mg  7 mg Transdermal Daily Allyne Hebert, Jackquline Denmark, MD   7 mg at 04/15/19 0810  . OLANZapine zydis (ZYPREXA) disintegrating tablet 10 mg  10 mg Oral Q8H PRN Mariel Craft, MD       And  . ziprasidone (GEODON) injection 20 mg  20 mg Intramuscular PRN Mariel Craft, MD        Lab Results: No results found for this or any previous visit (from the past  48 hour(s)).  Blood Alcohol level:  Lab Results  Component Value Date   ETH 123 (H) 04/08/2019    Metabolic Disorder Labs: No results found for: HGBA1C, MPG No results found for: PROLACTIN No results found for: CHOL, TRIG, HDL, CHOLHDL, VLDL, LDLCALC  Physical Findings: AIMS:  , ,  ,  ,    CIWA:  CIWA-Ar Total: 0 COWS:     Musculoskeletal: Strength & Muscle Tone: within normal limits Gait & Station: normal Patient leans: N/A  Psychiatric Specialty Exam: Physical Exam  Nursing note and vitals reviewed. Constitutional: He appears well-developed and well-nourished.  HENT:  Head: Normocephalic and atraumatic.  Eyes: Pupils are equal, round, and reactive to light. Conjunctivae are normal.  Neck: Normal range of motion.  Cardiovascular: Regular rhythm and normal heart sounds.  Respiratory: Effort normal.  GI: Soft.  Musculoskeletal: Normal range of motion.  Neurological: He is alert.  Skin: Skin is warm and dry.  Psychiatric: He has a normal mood and affect. His behavior is normal. Judgment and thought content normal.    Review of Systems  Constitutional: Negative.   HENT: Negative.   Eyes: Negative.   Respiratory: Negative.   Cardiovascular: Negative.   Gastrointestinal: Negative.   Musculoskeletal: Negative.   Skin: Negative.   Neurological: Negative.   Psychiatric/Behavioral: Negative.     Blood pressure 110/76, pulse (!) 59, temperature 98.3 F (36.8 C), temperature source Oral, resp. rate 18, height 5\' 8"  (1.727 m), weight 83.9 kg, SpO2 97 %.Body mass index is 28.12 kg/m.  General Appearance: Casual  Eye Contact:  Good  Speech:  Clear and Coherent  Volume:  Normal  Mood:  Euthymic  Affect:  Constricted  Thought Process:  Goal Directed  Orientation:  Full (Time, Place, and Person)  Thought Content:  Logical  Suicidal Thoughts:  No  Homicidal Thoughts:  No  Memory:  Immediate;   Fair Recent;   Fair Remote;   Fair  Judgement:  Fair  Insight:  Fair   Psychomotor Activity:  Normal  Concentration:  Concentration: Fair  Recall:  [employer] of Knowledge:  Fair  Language:  Fair  Akathisia:  No  Handed:  Right  AIMS (if indicated):     Assets:  Desire for Improvement Physical Health  ADL's:  Intact  Cognition:  WNL  Sleep:  Number of Hours: 5.75     Treatment Plan Summary: Plan Patient has been [facility] for [facility].  Preparations will be made so that he can be discharged in the morning.  Patient agreeable to the plan.  Mordecai Rasmussen, MD 04/15/2019, 4:34 PM

## 2019-04-15 NOTE — B[facility] Group Notes (Signed)
LCSW Group Therapy Note  04/15/2019 1:00 PM  Type of Therapy/Topic:  Group Therapy:  Emotion Regulation  Participation Level:  Active   Description of Group:   The purpose of this group is to assist patients in learning to regulate negative emotions and experience positive emotions. Patients will be guided to discuss ways in which they have been vulnerable to their negative emotions. These vulnerabilities will be juxtaposed with experiences of positive emotions or situations, and patients will be challenged to use positive emotions to combat negative ones. Special emphasis will be placed on coping with negative emotions in conflict situations, and patients will process healthy conflict resolution skills.  Therapeutic Goals: 1. Patient will identify two positive emotions or experiences to reflect on in order to balance out negative emotions 2. Patient will label two or more emotions that they find the most difficult to experience 3. Patient will demonstrate positive conflict resolution skills through discussion and/or role plays  Summary of Patient Progress: Patient was present in group and supportive. Patient engaged in how feeling like he has been belittled by others in his life has been problematic for him.  Patient dicussed how he has effectively established boundaries but continues to struggle with conflict resolution skills. Patient shared how he is trying to learn to walk away.   Therapeutic Modalities:   Cognitive Behavioral Therapy Feelings Identification Dialectical Behavioral Therapy  Penni Homans, MSW, LCSW 04/15/2019 12:44 PM

## 2019-04-16 NOTE — Plan of Care (Signed)
Patient compliant with medications per MD orders and procedures on the unit. Patient stated to this writer that he is ready to discharge to [facility] tomorrow.   Problem: Education: Goal: Emotional status will improve Outcome: Progressing Goal: Mental status will improve Outcome: Progressing

## 2019-04-16 NOTE — Progress Notes (Signed)
D -Patient was pleasant during assessmentand medication administration. Patient denies SI/HI/AVH, pain, and depression with this Clinical research associate. Patient stated his anxiety was 4/10, see MAR. Patient was active on the unit this evening and was observed interacting appropriately with staff and peers. Patient stated his anxiety was from going to [facility] tomorrow and just being a little nervous about going to a new facility.   A - Patientwas compliant with medication administration per MD orders andprocedures on the unit. Patient was given education. Patient was given support and encouragement to be active in his treatment plan. Patient informed to let staff know if there are any issues or problems on the unit.   R - Patient being monitored Q 15 minutes for safety per unit protocol. Patient remains safe on the unit.

## 2019-04-16 NOTE — Discharge Summary (Signed)
Physician Discharge Summary Note  Patient:  Brad Chang is an 48 y.o., male MRN:  [id number] DOB:  [birth year] Patient phone:  [phone] (home)  Patient address:   [address] [place] [address],  Total Time spent with patient: 45 minutes  Date of Admission:  04/08/2019 Date of Discharge: Apr 16, 2019  Reason for Admission: Patient admitted to the hospital because of complaints of suicidal ideation with overdose as well as alcohol abuse and ongoing depression.  Principal Problem: Severe major depression, single episode, without psychotic features [facility]) Discharge Diagnoses: Principal Problem:   Severe major depression, single episode, without psychotic features ([place]) Active Problems:   Blindness of left eye   Benign essential hypertension   Cocaine abuse ([place])   Suicidal behavior   Alcohol abuse   Past Psychiatric History: No previous psychiatric treatment.  Minimal experience with substance abuse treatment  Past Medical History:  Past Medical History:  Diagnosis Date  . Hypertension     Past Surgical History:  Procedure Laterality Date  . EYE SURGERY     Family History:  Family History  Problem Relation Age of Onset  . Hypertension Mother   . Cancer Father    Family Psychiatric  History: See previous Social History:  Social History   Substance and Sexual Activity  Alcohol Use Yes     Social History   Substance and Sexual Activity  Drug Use Never    Social History   Socioeconomic History  . Marital status: Single    Spouse name: Not on file  . Number of children: Not on file  . Years of education: Not on file  . Highest education level: Not on file  Occupational History  . Not on file  Social Needs  . Financial resource strain: Not on file  . Food insecurity:    Worry: Not on file    Inability: Not on file  . Transportation needs:    Medical: Not on file    Non-medical: Not on file  Tobacco Use  . Smoking status: Never Smoker  .  Smokeless tobacco: Never Used  Substance and Sexual Activity  . Alcohol use: Yes  . Drug use: Never  . Sexual activity: Not on file  Lifestyle  . Physical activity:    Days per week: Not on file    Minutes per session: Not on file  . Stress: Not on file  Relationships  . Social connections:    Talks on phone: Not on file    Gets together: Not on file    Attends religious service: Not on file    Active member of club or organization: Not on file    Attends meetings of clubs or organizations: Not on file    Relationship status: Not on file  Other Topics Concern  . Not on file  Social History Narrative  . Not on file    Hospital Course: Patient was admitted to the psychiatric ward.  15-minute checks were employed.  Patient was safe and did not display any dangerous or aggressive behavior during his hospital stay.  He was cooperative with the full treatment plan.  Met with individual members of the treatment team and the full treatment team to discuss goals.  Patient was started on antidepressant medicine using mirtazapine and has tolerated that without difficulty.  Sleep has improved.  Mood feels more hopeful and positive.  He denies any suicidal thoughts intent or plan at this point.  He has shown good insight about  his substance abuse use.  Recommendation was made to try to refer him to a longer term rehab and he has been accepted to the [facility] drug [facility] treatment [facility] in [place].  Patient is to be transferred today and is fully agreeable to that plan.  He understands this will be a 2-week approximately stay to further solidify commitment to sobriety.  Prescriptions prepared.  Blood pressure is been under good control with medication in the hospital.  No sign of withdrawal ever noted no other new medical problems.  Physical Findings: AIMS:  , ,  ,  ,    CIWA:  CIWA-Ar Total: 0 COWS:     Musculoskeletal: Strength & Muscle Tone: within normal limits Gait & Station: normal  Patient leans: N/A  Psychiatric Specialty Exam: Physical Exam  Nursing note and vitals reviewed. Constitutional: He appears well-developed and well-nourished.  HENT:  Head: Normocephalic and atraumatic.    Eyes: Pupils are equal, round, and reactive to light. Conjunctivae are normal.  Neck: Normal range of motion.  Cardiovascular: Normal heart sounds.  Respiratory: Effort normal.  GI: Soft.  Musculoskeletal: Normal range of motion.  Neurological: He is alert.  Skin: Skin is warm and dry.  Psychiatric: He has a normal mood and affect. His speech is normal and behavior is normal. Judgment and thought content normal. Cognition and memory are normal.    Review of Systems  Constitutional: Negative.   HENT: Negative.   Eyes: Negative.   Respiratory: Negative.   Cardiovascular: Negative.   Gastrointestinal: Negative.   Musculoskeletal: Negative.   Skin: Negative.   Neurological: Negative.   Psychiatric/Behavioral: Negative.     Blood pressure (!) 141/82, pulse 66, temperature 98 F (36.7 C), temperature source Oral, resp. rate 18, height 5' 8"  (1.727 m), weight 83.9 kg, SpO2 99 %.Body mass index is 28.12 kg/m.  General Appearance: Fairly Groomed  Eye Contact:  Good  Speech:  Clear and Coherent  Volume:  Normal  Mood:  Euthymic  Affect:  Congruent  Thought Process:  Goal Directed  Orientation:  Full (Time, Place, and Person)  Thought Content:  Logical  Suicidal Thoughts:  No  Homicidal Thoughts:  No  Memory:  Immediate;   Fair Recent;   Fair Remote;   Fair  Judgement:  Fair  Insight:  Fair  Psychomotor Activity:  Normal  Concentration:  Concentration: Fair  Recall:  [employer] of Knowledge:  Fair  Language:  Fair  Akathisia:  No  Handed:  Right  AIMS (if indicated):     Assets:  Desire for Improvement Physical Health Resilience  ADL's:  Intact  Cognition:  WNL  Sleep:  Number of Hours: 6     Have you used any form of tobacco in the last 30 days?  (Cigarettes, Smokeless Tobacco, Cigars, and/or Pipes): Yes  Has this patient used any form of tobacco in the last 30 days? (Cigarettes, Smokeless Tobacco, Cigars, and/or Pipes) Yes, Yes, A prescription for an FDA-approved tobacco cessation medication was offered at discharge and the patient refused  Blood Alcohol level:  Lab Results  Component Value Date   ETH 123 (H) 76/73/4193    Metabolic Disorder Labs:  No results found for: HGBA1C, MPG No results found for: PROLACTIN No results found for: CHOL, TRIG, HDL, CHOLHDL, VLDL, LDLCALC  See Psychiatric Specialty Exam and Suicide Risk Assessment completed by Attending Physician prior to discharge.  Discharge destination:  [facility]  Is patient on multiple antipsychotic therapies at discharge:  No  Has Patient had three or more failed trials of antipsychotic monotherapy by history:  No  Recommended Plan for Multiple Antipsychotic Therapies: NA  Discharge Instructions    Diet - low sodium heart healthy   Complete by:  As directed    Increase activity slowly   Complete by:  As directed      Allergies as of 04/16/2019      Reactions   Penicillins Other (See Comments)      Medication List    STOP taking these medications   cyclobenzaprine 10 MG tablet Commonly known as:  FLEXERIL     TAKE these medications     Indication  hydrochlorothiazide 12.5 MG capsule Commonly known as:  MICROZIDE Take 1 capsule (12.5 mg total) by mouth daily.  Indication:  High Blood Pressure Disorder   losartan 50 MG tablet Commonly known as:  COZAAR Take 1 tablet (50 mg total) by mouth daily.  Indication:  High Blood Pressure Disorder   mirtazapine 30 MG tablet Commonly known as:  REMERON Take 1 tablet (30 mg total) by mouth at bedtime.  Indication:  Major Depressive Disorder   naproxen 500 MG tablet Commonly known as:  NAPROSYN Take 1 tablet (500 mg total) by mouth 2 (two) times daily as needed for moderate pain or headache.  Indication:   Joint Damage causing Pain and Loss of Function      Follow-up Information    [facility] Follow [facility].   Why:  Please attend appointment at [facility] at 1:30pm 04/16/2019 Contact information: [address] [place] [address] [phone]           Follow-up recommendations:  Activity:  Activity as tolerated Diet:  Regular diet Other:  Encouragement to stay the full course at substance abuse treatment and work on establishing sobriety.  Comments: Patient has been calm and appropriate denying suicidal ideation or days.  He is agreeable to follow-up substance abuse treatment.  Transportation is planned for late morning.  Prescriptions are all provided.  Signed: Alethia Berthold, MD 04/16/2019, 9:57 AM

## 2019-04-16 NOTE — Progress Notes (Signed)
  [facility] Adult Case Management Discharge Plan :  Will you be returning to the same living situation after discharge:  No. Patient is being referred to [facility]. At discharge, do you have transportation home?: Yes,  Sheriff will transport to [facility] Do you have the ability to pay for your medications: No.  Release of information consent forms completed and in the chart;  Patient's signature needed at discharge.  Patient to Follow up at: Follow-up Information    [facility] Follow [facility].   Why:  Please attend appointment at [facility] at 1:30pm 04/16/2019 Contact information: [address] [place] [address] [phone]           Next level of care provider has access to [facility] Link:no  Safety Planning and Suicide Prevention discussed: Yes,  SPE completed with the patient's wife.  Have you used any form of tobacco in the last 30 days? (Cigarettes, Smokeless Tobacco, Cigars, and/or Pipes): Yes  Has patient been referred to the Quitline?: N/A patient is not a smoker  Patient has been referred for addiction treatment: Yes  Harden Mo, LCSW 04/16/2019, 8:53 AM

## 2019-04-16 NOTE — Progress Notes (Signed)
Recreation Therapy Notes  INPATIENT RECREATION TR PLAN  Patient Details Name: Brad Chang MRN: [id number] DOB: January 19, 1971 Today's Date: 04/16/2019  Rec Therapy Plan Is patient appropriate for Therapeutic Recreation?: Yes Treatment times per week: At least 3 Estimated Length of Stay: 5-7 days TR Treatment/Interventions: Group participation (Comment)  Discharge Criteria Pt will be discharged from therapy if:: Discharged Treatment plan/goals/alternatives discussed and agreed upon by:: Patient/family  Discharge Summary Short term goals set: Patient will engage in groups without prompting or encouragement from LRT x3 group sessions within 5 recreation therapy group sessions Short term goals met: Not met Progress toward goals comments: Groups attended Which groups?: Other (Comment)(Problem Solving) Reason goals not met: Patient spent most of his time in his room Therapeutic equipment acquired: N/A Reason patient discharged from therapy: Discharge from hospital Pt/family agrees with progress & goals achieved: Yes Date patient discharged from therapy: 04/16/19   Lawrnce Reyez 04/16/2019, 11:55 AM

## 2019-04-16 NOTE — Progress Notes (Signed)
Recreation Therapy Notes  Date: 04/16/2019   Time: 9:30 am   Location: Craft room   Behavioral response: N/A   Intervention Topic: Decision Making  Discussion/Intervention: Patient did not attend group.   Clinical Observations/Feedback:  Patient did not attend group.   Atilano Covelli LRT/CTRS          Abeer Deskins 04/16/2019 10:32 AM 

## 2019-07-17 IMAGING — US US ABDOMEN LIMITED
1 series · 14 of 25 positions shown · non-contrast
Comparison: None.

CLINICAL DATA: Abnormal LFTs.

EXAM:
ULTRASOUND ABDOMEN LIMITED RIGHT UPPER QUADRANT

[Series 1: us abdomen limited · 0.23mm/px · 14 of 54 slices shown]
[im 1/54]
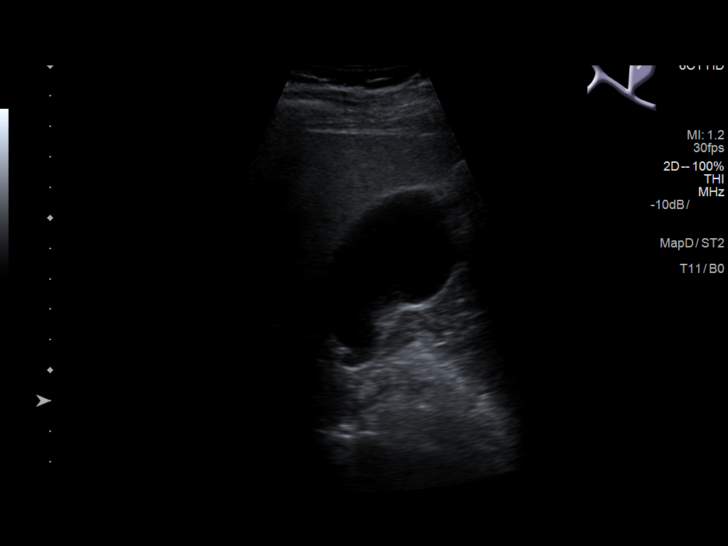
[im 5/54]
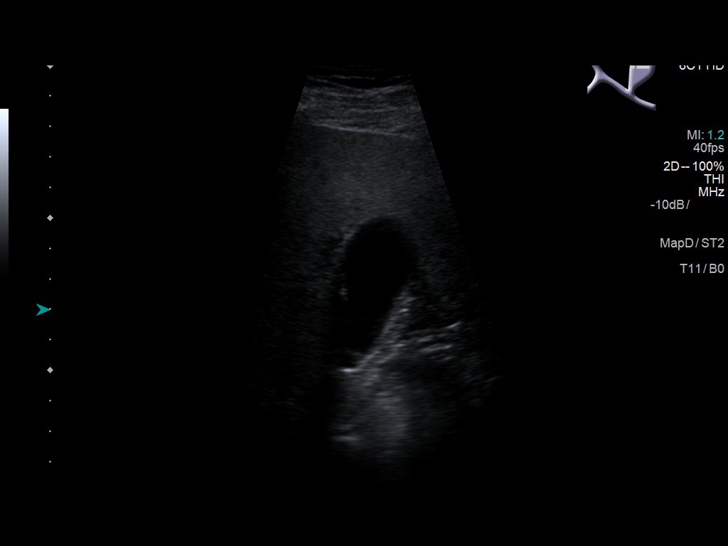
[im 9/54]
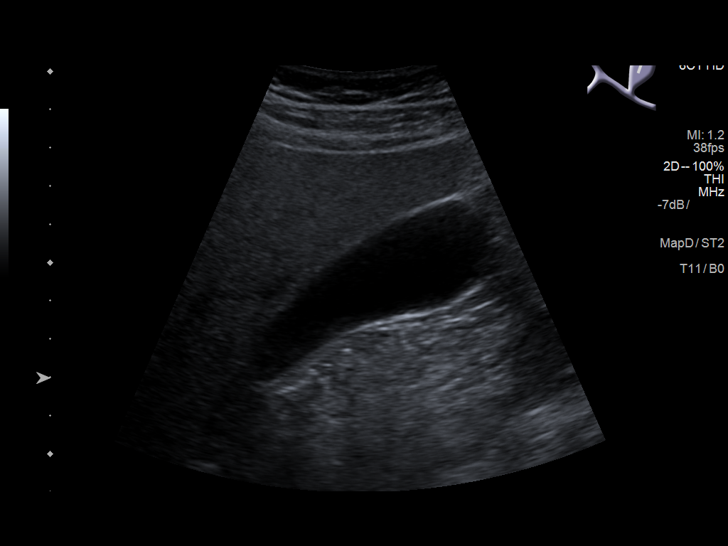
[im 14/54]
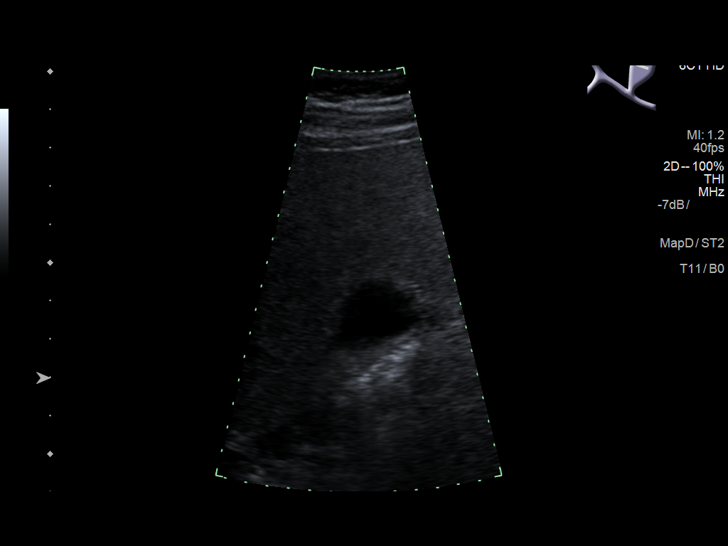
[im 18/54]
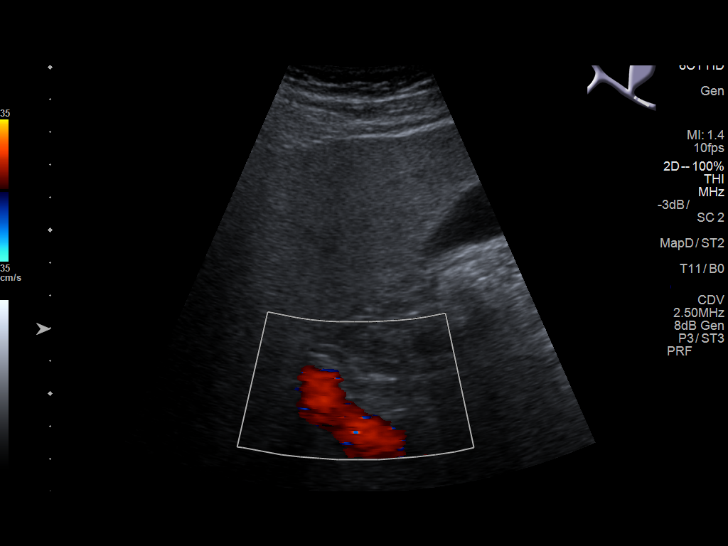
[im 20/54]
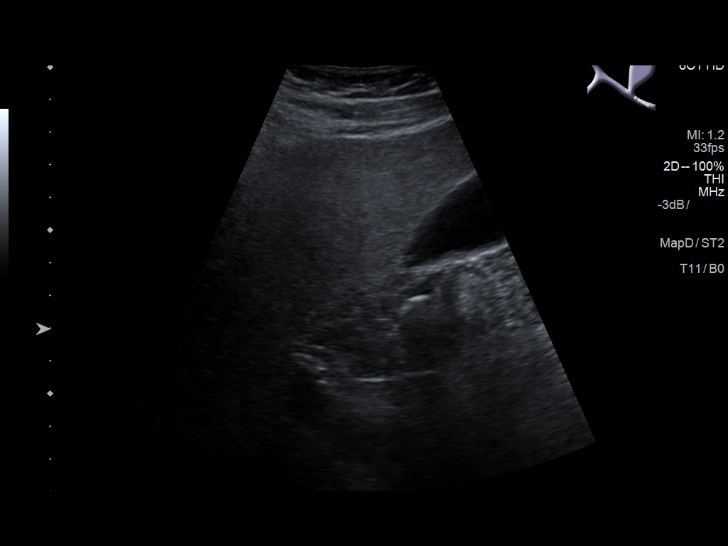
[im 25/54]
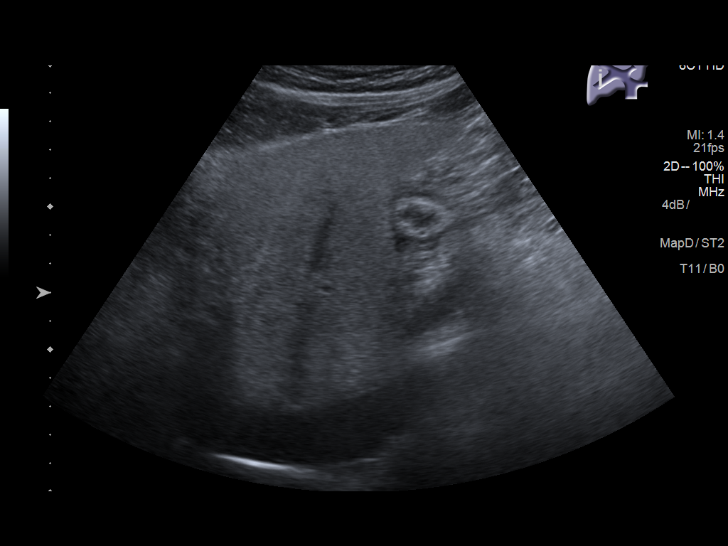
[im 29/54]
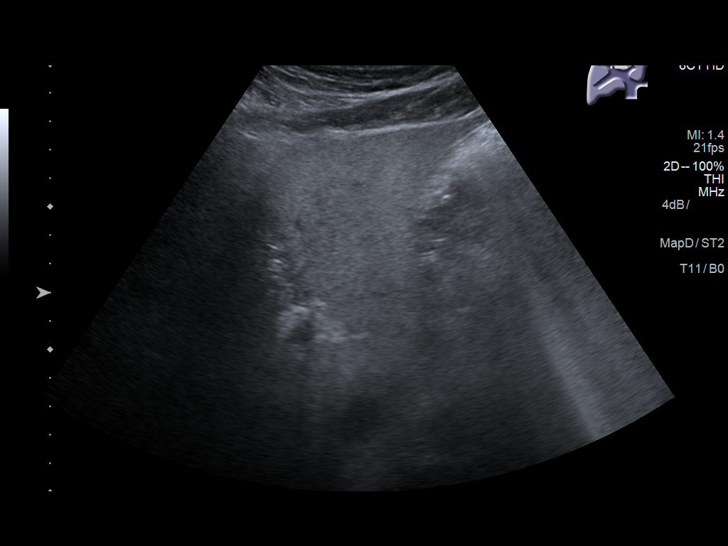
[im 34/54]
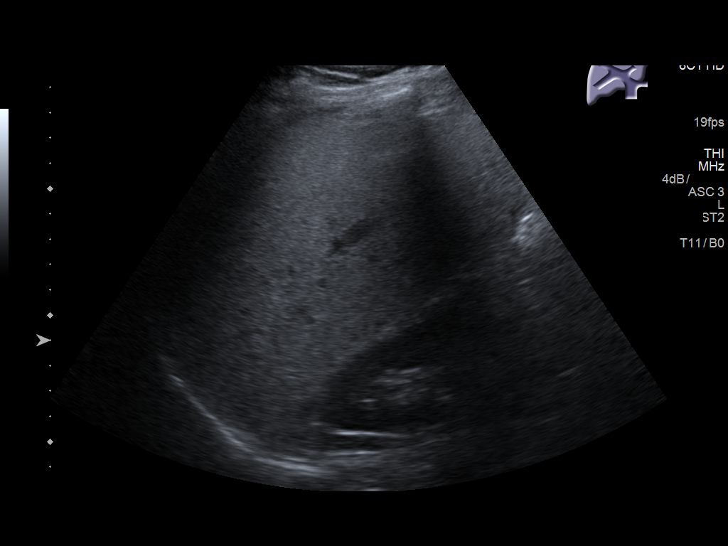
[im 36/54]
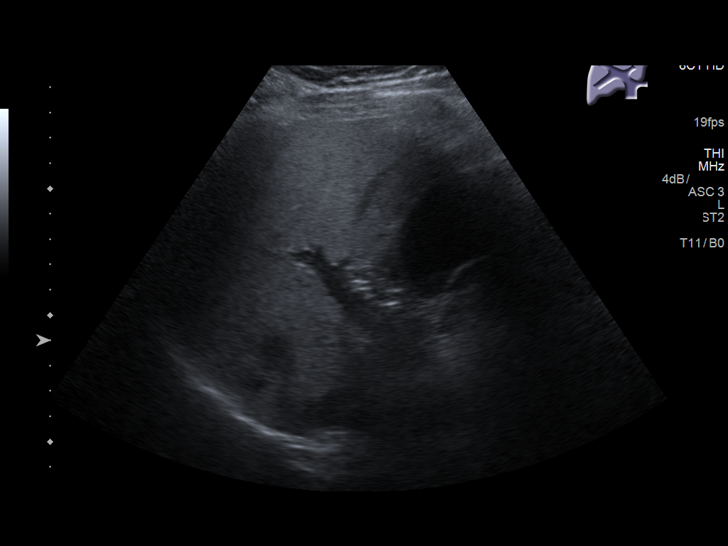
[im 40/54]
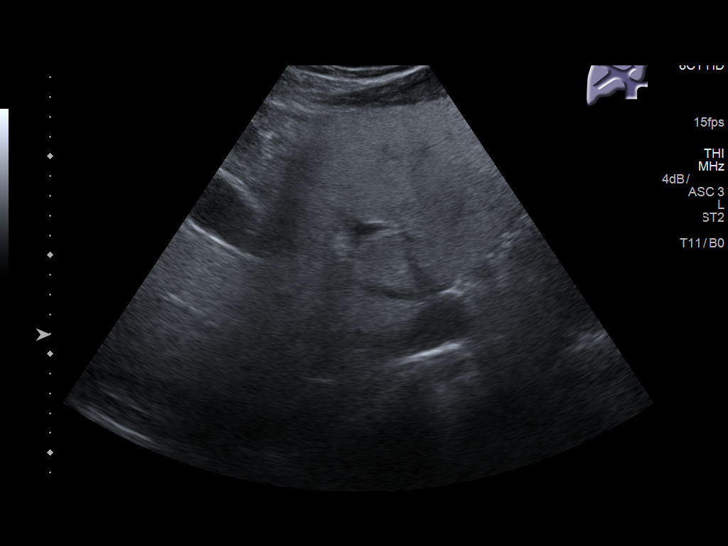
[im 45/54]
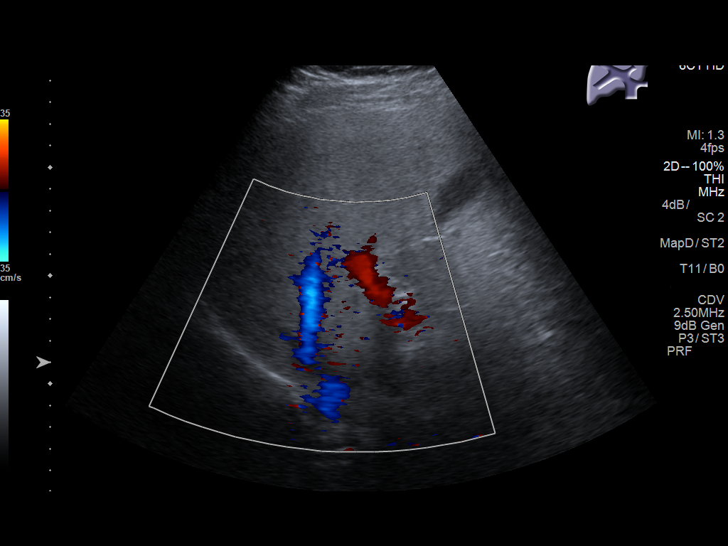
[im 49/54]
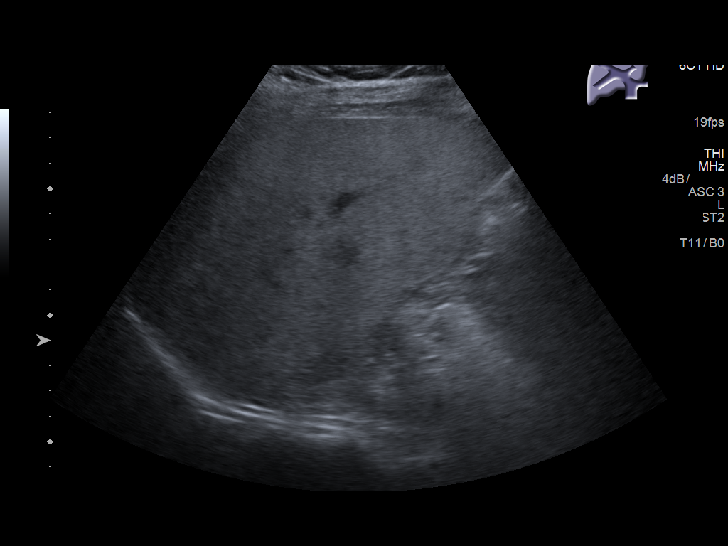
[im 54/54]
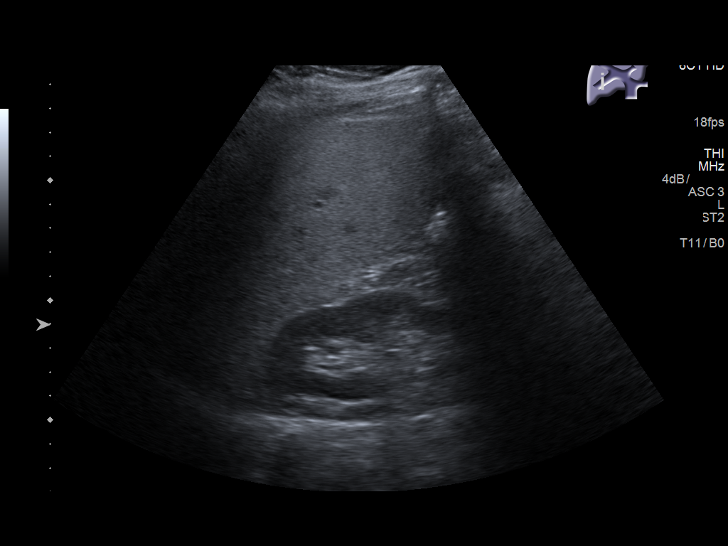

[14 of 25 positions shown; findings below may reference images not displayed]

FINDINGS: Gallbladder:

No gallstones or wall thickening visualized. No sonographic Murphy
sign noted by sonographer.

Common bile duct:

Diameter: 4.6 mm.

Liver:

Increase hepatic parenchymal echogenicity without focal mass. Portal
vein is patent on color Doppler imaging with normal direction of
blood flow towards the liver.
IMPRESSION: No acute hepatobiliary findings.

Hepatic steatosis without focal mass.
